# Patient Record
Sex: Female | Born: 1935 | Race: Black or African American | Hispanic: Yes | Smoking: Never smoker
Health system: Southern US, Community
[De-identification: ages and names within clinical notes are randomized; demographics above are authoritative.]

## PROBLEM LIST (undated history)

## (undated) DIAGNOSIS — IMO0001 Reserved for inherently not codable concepts without codable children: Secondary | ICD-10-CM

## (undated) DIAGNOSIS — J4489 Other specified chronic obstructive pulmonary disease: Secondary | ICD-10-CM

## (undated) DIAGNOSIS — K589 Irritable bowel syndrome without diarrhea: Secondary | ICD-10-CM

## (undated) DIAGNOSIS — Z860100 Personal history of colon polyps, unspecified: Secondary | ICD-10-CM

## (undated) DIAGNOSIS — G4700 Insomnia, unspecified: Secondary | ICD-10-CM

## (undated) DIAGNOSIS — C801 Malignant (primary) neoplasm, unspecified: Secondary | ICD-10-CM

## (undated) DIAGNOSIS — Z5189 Encounter for other specified aftercare: Secondary | ICD-10-CM

## (undated) DIAGNOSIS — J45909 Unspecified asthma, uncomplicated: Secondary | ICD-10-CM

## (undated) DIAGNOSIS — J449 Chronic obstructive pulmonary disease, unspecified: Secondary | ICD-10-CM

## (undated) DIAGNOSIS — K219 Gastro-esophageal reflux disease without esophagitis: Secondary | ICD-10-CM

## (undated) DIAGNOSIS — R5381 Other malaise: Secondary | ICD-10-CM

## (undated) DIAGNOSIS — D649 Anemia, unspecified: Secondary | ICD-10-CM

## (undated) DIAGNOSIS — R1013 Epigastric pain: Secondary | ICD-10-CM

## (undated) DIAGNOSIS — I10 Essential (primary) hypertension: Secondary | ICD-10-CM

## (undated) DIAGNOSIS — N184 Chronic kidney disease, stage 4 (severe): Secondary | ICD-10-CM

## (undated) DIAGNOSIS — R5383 Other fatigue: Secondary | ICD-10-CM

## (undated) DIAGNOSIS — C259 Malignant neoplasm of pancreas, unspecified: Secondary | ICD-10-CM

## (undated) DIAGNOSIS — F419 Anxiety disorder, unspecified: Secondary | ICD-10-CM

## (undated) DIAGNOSIS — R0602 Shortness of breath: Secondary | ICD-10-CM

## (undated) DIAGNOSIS — M1990 Unspecified osteoarthritis, unspecified site: Secondary | ICD-10-CM

## (undated) HISTORY — PX: THROAT SURGERY: SHX803

## (undated) HISTORY — DX: Anemia, unspecified: D64.9

## (undated) HISTORY — PX: NECK SURGERY: SHX720

## (undated) HISTORY — DX: Chronic obstructive pulmonary disease, unspecified: J44.9

## (undated) HISTORY — DX: Personal history of colon polyps, unspecified: Z86.0100

## (undated) HISTORY — DX: Anxiety disorder, unspecified: F41.9

## (undated) HISTORY — PX: TUMOR REMOVAL: SHX12

## (undated) HISTORY — DX: Malignant neoplasm of pancreas, unspecified: C25.9

## (undated) HISTORY — DX: Malignant (primary) neoplasm, unspecified: C80.1

## (undated) HISTORY — DX: Epigastric pain: R10.13

## (undated) HISTORY — DX: Personal history of colonic polyps: Z86.010

## (undated) HISTORY — PX: ABDOMINAL HYSTERECTOMY: SHX81

## (undated) HISTORY — DX: Other malaise: R53.81

## (undated) HISTORY — DX: Chronic kidney disease, stage 4 (severe): N18.4

## (undated) HISTORY — DX: Insomnia, unspecified: G47.00

## (undated) HISTORY — DX: Unspecified asthma, uncomplicated: J45.909

## (undated) HISTORY — DX: Other fatigue: R53.83

## (undated) HISTORY — DX: Other specified chronic obstructive pulmonary disease: J44.89

---

## 2011-01-13 DIAGNOSIS — I959 Hypotension, unspecified: Secondary | ICD-10-CM

## 2011-02-05 ENCOUNTER — Other Ambulatory Visit: Payer: Self-pay | Admitting: Nephrology

## 2011-02-05 ENCOUNTER — Ambulatory Visit
Admission: RE | Admit: 2011-02-05 | Discharge: 2011-02-05 | Disposition: A | Discharge status: Home / Self Care | Payer: Medicare Other | Source: Ambulatory Visit | Attending: Nephrology | Admitting: Nephrology

## 2011-02-05 DIAGNOSIS — R05 Cough: Secondary | ICD-10-CM

## 2011-02-05 DIAGNOSIS — R0600 Dyspnea, unspecified: Secondary | ICD-10-CM

## 2011-02-19 ENCOUNTER — Ambulatory Visit (INDEPENDENT_AMBULATORY_CARE_PROVIDER_SITE_OTHER): Payer: Medicare Other

## 2011-02-19 ENCOUNTER — Observation Stay (HOSPITAL_COMMUNITY)
Admission: EM | Admit: 2011-02-19 | Discharge: 2011-02-20 | Disposition: A | Discharge status: Home / Self Care | Payer: Medicare Other | Attending: Family Medicine | Admitting: Family Medicine

## 2011-02-19 ENCOUNTER — Encounter: Payer: Self-pay | Admitting: Emergency Medicine

## 2011-02-19 DIAGNOSIS — A088 Other specified intestinal infections: Secondary | ICD-10-CM

## 2011-02-19 DIAGNOSIS — E118 Type 2 diabetes mellitus with unspecified complications: Secondary | ICD-10-CM

## 2011-02-19 DIAGNOSIS — R51 Headache: Secondary | ICD-10-CM

## 2011-02-19 DIAGNOSIS — R112 Nausea with vomiting, unspecified: Secondary | ICD-10-CM | POA: Diagnosis present

## 2011-02-19 DIAGNOSIS — N186 End stage renal disease: Secondary | ICD-10-CM

## 2011-02-19 DIAGNOSIS — J45909 Unspecified asthma, uncomplicated: Secondary | ICD-10-CM | POA: Insufficient documentation

## 2011-02-19 DIAGNOSIS — Z992 Dependence on renal dialysis: Secondary | ICD-10-CM | POA: Insufficient documentation

## 2011-02-19 DIAGNOSIS — I10 Essential (primary) hypertension: Secondary | ICD-10-CM

## 2011-02-19 DIAGNOSIS — E860 Dehydration: Secondary | ICD-10-CM

## 2011-02-19 DIAGNOSIS — I120 Hypertensive chronic kidney disease with stage 5 chronic kidney disease or end stage renal disease: Secondary | ICD-10-CM | POA: Insufficient documentation

## 2011-02-19 DIAGNOSIS — K589 Irritable bowel syndrome without diarrhea: Secondary | ICD-10-CM

## 2011-02-19 DIAGNOSIS — R6883 Chills (without fever): Secondary | ICD-10-CM | POA: Insufficient documentation

## 2011-02-19 DIAGNOSIS — N189 Chronic kidney disease, unspecified: Secondary | ICD-10-CM

## 2011-02-19 HISTORY — DX: Encounter for other specified aftercare: Z51.89

## 2011-02-19 HISTORY — DX: Gastro-esophageal reflux disease without esophagitis: K21.9

## 2011-02-19 HISTORY — DX: Reserved for inherently not codable concepts without codable children: IMO0001

## 2011-02-19 HISTORY — DX: Essential (primary) hypertension: I10

## 2011-02-19 HISTORY — DX: Shortness of breath: R06.02

## 2011-02-19 HISTORY — DX: Irritable bowel syndrome, unspecified: K58.9

## 2011-02-19 HISTORY — DX: Unspecified osteoarthritis, unspecified site: M19.90

## 2011-02-19 LAB — DIFFERENTIAL
Basophils Absolute: 0 10*3/uL (ref 0.0–0.1)
Basophils Relative: 0 % (ref 0–1)
Eosinophils Absolute: 0 10*3/uL (ref 0.0–0.7)
Eosinophils Relative: 0 % (ref 0–5)
Lymphocytes Relative: 7 % — ABNORMAL LOW (ref 12–46)

## 2011-02-19 LAB — CBC
MCHC: 31.2 g/dL (ref 30.0–36.0)
MCV: 90.4 fL (ref 78.0–100.0)
Platelets: 205 10*3/uL (ref 150–400)
RDW: 14 % (ref 11.5–15.5)
WBC: 10.7 10*3/uL — ABNORMAL HIGH (ref 4.0–10.5)

## 2011-02-19 LAB — HEPATIC FUNCTION PANEL
ALT: 14 U/L (ref 0–35)
Total Protein: 7.4 g/dL (ref 6.0–8.3)

## 2011-02-19 LAB — BASIC METABOLIC PANEL
CO2: 37 mEq/L — ABNORMAL HIGH (ref 19–32)
Calcium: 10.1 mg/dL (ref 8.4–10.5)
Creatinine, Ser: 4.82 mg/dL — ABNORMAL HIGH (ref 0.50–1.10)

## 2011-02-19 MED ORDER — ALBUTEROL SULFATE HFA 108 (90 BASE) MCG/ACT IN AERS
2.0000 | INHALATION_SPRAY | RESPIRATORY_TRACT | Status: DC | PRN
  Filled 2011-02-19: qty 6.7

## 2011-02-19 MED ORDER — NIFEDIPINE ER 60 MG PO TB24
60.0000 mg | ORAL_TABLET | Freq: Every day | ORAL | Status: DC
  Administered 2011-02-19 – 2011-02-20 (×2): 60 mg via ORAL
  Filled 2011-02-19 (×2): qty 1

## 2011-02-19 MED ORDER — PANTOPRAZOLE SODIUM 40 MG PO TBEC
40.0000 mg | DELAYED_RELEASE_TABLET | Freq: Every day | ORAL | Status: DC
  Administered 2011-02-19 – 2011-02-20 (×2): 40 mg via ORAL

## 2011-02-19 MED ORDER — ONDANSETRON HCL 4 MG PO TABS: 4.0000 mg | ORAL_TABLET | Freq: Four times a day (QID) | ORAL | Status: DC | PRN

## 2011-02-19 MED ORDER — METOPROLOL TARTRATE 50 MG PO TABS
50.0000 mg | ORAL_TABLET | Freq: Two times a day (BID) | ORAL | Status: DC
  Administered 2011-02-19 – 2011-02-20 (×2): 50 mg via ORAL
  Filled 2011-02-19 (×3): qty 1

## 2011-02-19 MED ORDER — RENA-VITE PO TABS
1.0000 | ORAL_TABLET | Freq: Every day | ORAL | Status: DC
  Administered 2011-02-20: 1 via ORAL
  Filled 2011-02-19: qty 1

## 2011-02-19 MED ORDER — ONDANSETRON HCL 4 MG/2ML IJ SOLN
4.0000 mg | Freq: Once | INTRAMUSCULAR | Status: AC
  Administered 2011-02-19: 4 mg via INTRAMUSCULAR
  Filled 2011-02-19: qty 2

## 2011-02-19 MED ORDER — ASPIRIN 81 MG PO TABS: 81.0000 mg | ORAL_TABLET | Freq: Every day | ORAL | Status: DC

## 2011-02-19 MED ORDER — HEPARIN SODIUM (PORCINE) 5000 UNIT/ML IJ SOLN
5000.0000 [IU] | Freq: Three times a day (TID) | INTRAMUSCULAR | Status: DC
  Administered 2011-02-19 – 2011-02-20 (×3): 5000 [IU] via SUBCUTANEOUS
  Filled 2011-02-19 (×5): qty 1

## 2011-02-19 MED ORDER — DARBEPOETIN ALFA-POLYSORBATE 25 MCG/0.42ML IJ SOLN
25.0000 ug | INTRAMUSCULAR | Status: DC
  Administered 2011-02-20: 25 ug via INTRAVENOUS_CENTRAL
  Administered 2011-02-20: 25 ug via INTRAVENOUS
  Filled 2011-02-19: qty 0.42

## 2011-02-19 MED ORDER — FLUTICASONE PROPIONATE HFA 44 MCG/ACT IN AERO
1.0000 | INHALATION_SPRAY | Freq: Two times a day (BID) | RESPIRATORY_TRACT | Status: DC
  Administered 2011-02-19 – 2011-02-20 (×2): 1 via RESPIRATORY_TRACT
  Filled 2011-02-19: qty 10.6

## 2011-02-19 MED ORDER — POLYETHYLENE GLYCOL 3350 17 G PO PACK
17.0000 g | PACK | Freq: Every day | ORAL | Status: DC
  Administered 2011-02-19 – 2011-02-20 (×2): 17 g via ORAL
  Filled 2011-02-19 (×2): qty 1

## 2011-02-19 MED ORDER — SODIUM CHLORIDE 0.9 % IV BOLUS (SEPSIS)
500.0000 mL | INTRAVENOUS | Status: AC
  Administered 2011-02-19: 500 mL via INTRAVENOUS

## 2011-02-19 MED ORDER — ASPIRIN EC 81 MG PO TBEC
81.0000 mg | DELAYED_RELEASE_TABLET | Freq: Every day | ORAL | Status: DC
  Administered 2011-02-19 – 2011-02-20 (×2): 81 mg via ORAL
  Filled 2011-02-19 (×2): qty 1

## 2011-02-19 MED ORDER — MONTELUKAST SODIUM 10 MG PO TABS
10.0000 mg | ORAL_TABLET | Freq: Every day | ORAL | Status: DC
  Administered 2011-02-19: 10 mg via ORAL
  Filled 2011-02-19 (×2): qty 1

## 2011-02-19 MED ORDER — OLMESARTAN MEDOXOMIL 40 MG PO TABS
40.0000 mg | ORAL_TABLET | Freq: Every day | ORAL | Status: DC
  Administered 2011-02-19: 40 mg via ORAL
  Filled 2011-02-19 (×2): qty 1

## 2011-02-19 NOTE — ED Provider Notes (Signed)
History     CSN: [id number] Arrival date & time: 02/19/2011 12:23 PM   First MD Initiated Contact with Patient 02/19/11 1230      Chief Complaint  Patient presents with  . Nausea  . Emesis  . Chills   HPI Patient reports that she has had multiple episodes of nausea and vomiting. Patient reports dehdyrated. No abdominal pain. No chest pain. No SOB.   Past Medical History  Diagnosis Date  . Hypertension   . Renal disorder   . Asthma     Past Surgical History  Procedure Date  . Abdominal hysterectomy   . Throat surgery     History reviewed. No pertinent family history.  History  Substance Use Topics  . Smoking status: Never Smoker   . Smokeless tobacco: Never Used  . Alcohol Use: No    OB History    Grav Para Term Preterm Abortions TAB SAB Ect Mult Living                  Review of Systems  Constitutional: Negative for fever, chills, diaphoresis and appetite change.  HENT: Negative for neck pain.   Eyes: Negative for photophobia and visual disturbance.  Respiratory: Negative for cough, chest tightness and shortness of breath.   Cardiovascular: Negative for chest pain.  Gastrointestinal: Positive for nausea and vomiting. Negative for abdominal pain, constipation and blood in stool.  Genitourinary: Negative for flank pain.  Musculoskeletal: Negative for back pain.  Skin: Negative for rash.  Neurological: Negative for weakness and numbness.  All other systems reviewed and are negative.    Allergies  Excedrin  Home Medications   Current Outpatient Rx  Name Route Sig Dispense Refill  . ACETAMINOPHEN 325 MG PO TABS Oral Take 650 mg by mouth every 6 (six) hours as needed. For pain    . ASPIRIN 81 MG PO TABS Oral Take 81 mg by mouth daily. Except on dialysis days    . METOPROLOL TARTRATE 50 MG PO TABS Oral Take 50 mg by mouth 2 (two) times daily.      Marland Kitchen NIFEDIPINE ER 60 MG PO TB24 Oral Take 60 mg by mouth daily.      Marland Kitchen POLYETHYLENE GLYCOL 3350 PO PACK Oral  Take 17 g by mouth daily.        BP 191/89  Pulse 79  Temp(Src) 97.9 F (36.6 C) (Oral)  Resp 18  SpO2 95%  Physical Exam  Nursing note and vitals reviewed. Constitutional: She is oriented to person, place, and time. She appears well-developed and well-nourished. No distress.       Nontoxic appearing  HENT:  Head: Normocephalic and atraumatic.       Dry mucous membranes  Eyes: EOM are normal. Pupils are equal, round, and reactive to light.  Neck: Normal range of motion. Neck supple.  Cardiovascular: Normal rate and regular rhythm.   Pulmonary/Chest: Effort normal and breath sounds normal. No respiratory distress.  Abdominal: Soft. She exhibits no distension and no mass. There is no tenderness. There is no rebound and no guarding.       No pulsatile mass  Musculoskeletal: Normal range of motion. She exhibits no edema and no tenderness.       Negative straight leg test bilateral lower extremities. Baseline ROM, moves extremities with no obvious new focal weakness. Full strength in both lower extremities 5/5. No weakness with extension of great toe, L5 nerve room is not impaired. S1 tested and not impaired with testing ankle jerk  reflex, good plantar flexion.  Lymphadenopathy:    She has no cervical adenopathy.  Neurological: She is alert and oriented to person, place, and time. She has normal strength. She displays no atrophy. No cranial nerve deficit or sensory deficit. She exhibits normal muscle tone. She displays a negative Romberg sign. Gait normal. She displays no Babinski's sign on the right side. She displays no Babinski's sign on the left side.  Reflex Scores:      Patellar reflexes are 2+ on the right side and 2+ on the left side.      Achilles reflexes are 2+ on the right side and 2+ on the left side.       Advised patient of warning signs to return. Patient stated agreement and understanding.  Skin: Skin is warm and dry. No rash noted. She is not diaphoretic. No erythema. No  pallor.  Psychiatric: She has a normal mood and affect. Her behavior is normal. Judgment and thought content normal.    ED Course  Procedures (including critical care time)  Patient seen and evaluated.  VSS reviewed. . Nursing notes reviewed. Discussed with attending physician. Initial testing ordered. Will monitor the patient closely. They agree with the treatment plan and diagnosis.   Results for orders placed during the hospital encounter of 02/19/11  BASIC METABOLIC PANEL      Component Value Range   Sodium 144  135 - 145 (mEq/L)   Potassium 4.7  3.5 - 5.1 (mEq/L)   Chloride 95 (*) 96 - 112 (mEq/L)   CO2 37 (*) 19 - 32 (mEq/L)   Glucose, Bld 109 (*) 70 - 99 (mg/dL)   BUN 33 (*) 6 - 23 (mg/dL)   Creatinine, Ser 1.61 (*) 0.50 - 1.10 (mg/dL)   Calcium 09.6  8.4 - 10.5 (mg/dL)   GFR calc non Af Amer 7 (*) >90 (mL/min)   GFR calc Af Amer 9 (*) >90 (mL/min)  CBC      Component Value Range   WBC 10.7 (*) 4.0 - 10.5 (K/uL)   RBC 4.18  3.87 - 5.11 (MIL/uL)   Hemoglobin 11.8 (*) 12.0 - 15.0 (g/dL)   HCT 04.5  40.9 - 81.1 (%)   MCV 90.4  78.0 - 100.0 (fL)   MCH 28.2  26.0 - 34.0 (pg)   MCHC 31.2  30.0 - 36.0 (g/dL)   RDW 91.4  78.2 - 95.6 (%)   Platelets 205  150 - 400 (K/uL)  DIFFERENTIAL      Component Value Range   Neutrophils Relative 87 (*) 43 - 77 (%)   Neutro Abs 9.3 (*) 1.7 - 7.7 (K/uL)   Lymphocytes Relative 7 (*) 12 - 46 (%)   Lymphs Abs 0.7  0.7 - 4.0 (K/uL)   Monocytes Relative 6  3 - 12 (%)   Monocytes Absolute 0.6  0.1 - 1.0 (K/uL)   Eosinophils Relative 0  0 - 5 (%)   Eosinophils Absolute 0.0  0.0 - 0.7 (K/uL)   Basophils Relative 0  0 - 1 (%)   Basophils Absolute 0.0  0.0 - 0.1 (K/uL)   Dg Chest 2 View  02/05/2011  *RADIOLOGY REPORT*  Clinical Data: Dialysis patient with cough and shortness of breath.  CHEST - 2 VIEW  Comparison: None.  Findings: The heart is mildly enlarged.  Mediastinal contours are otherwise unremarkable for age with probable vascular  tortuosity accounting for right paratracheal prominence.  The lungs are clear. There is no pleural effusion.  Advanced glenohumeral degenerative  changes are present bilaterally.  There is an age indeterminate lower thoracic compression deformity.  IMPRESSION: No acute cardiopulmonary process identified.  Mild cardiomegaly.  Age indeterminate lower thoracic compression deformity. Per CMS PQRS reporting requirements (PQRS Measure 24): Given the patient's age of greater than 50 and the fracture site (hip, distal radius, or spine), the patient should be tested for osteoporosis using DXA, and the appropriate treatment considered based on the DXA results.  Original Report Authenticated By: Gerrianne Scale, M.D.    Patient seen and re-evaluated. Resting comfortably. VSS stable. NAD. Patient notified of testing results. Stated agreement and understanding. Patient stated understanding to treatment plan and diagnosis.   2:50 PM spoke to family practice center. Who admit the patient for observation for nausea, vomiting, dehydration.  2:50 PM spoke to daughter who was notified of lab findings and treatment plan. Stated agreement and understanding.    MDM   Nausea, vomiting,   Spoke to dialysis. Dehydrated. IVF. Dr. Briant Cedar wanted her evaluated. Dr. Rennis Harding admit for obstervation. Family practice.   Demetrius Charity, [place] 02/19/11 [phone]

## 2011-02-19 NOTE — Consult Note (Signed)
[facility] Renal Consultation Note  Indication for Consultation:  Management of ESRD/hemodialysis; anemia, hypertension/volume and secondary hyperparathyroidism  HPI: Brenda Leach is a 77 y.o. 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 Last dialysis was mon 12/03 and unremarkable per pt. Given iv fluids and Zofran in er with symptoms resolved now except for "stomach burning sensation I get with my Bowel syndrome"  Of note pt. Recently transferred to [facility] for HD from [facility]. 01/21/11 to live with daughter. HD started in [place] 12/28/2001.  Dialysis Orders: Center: [facility]  on tths . EDW 47kg HD Bath 2,0k,2.25ca  Time 3.0 hrs Heparin stand. Access left fa avgg BFR 400 DFR 800    Zemplar 4 mcg IV/HD Epogen 1400   Units IV/HD  Venofer  0  Other 0    Past Medical History  Diagnosis Date  . Hypertension   . ESRD (end stage renal disease) on dialysis   . Asthma   . IBS (irritable bowel syndrome)     Past Surgical History  Procedure Date  . Abdominal hysterectomy   . Throat surgery       Family History  Problem Relation Age of Onset  . Liver cancer Father   . Stroke Mother   . Coronary artery disease    . Hypertension    . Diabetes type II     Soc= Lives with daughter, 1 son in [place]. Moved from [place] as above   reports that she has never smoked. She has never used smokeless tobacco. She reports that she does not drink alcohol or use illicit drugs.   Allergies  Allergen Reactions  . Excedrin International aid/development worker) Other (See Comments)    dizziness    Prior to Admission medications   Medication Sig Start Date End Date Taking? Authorizing Provider  acetaminophen (TYLENOL) 325 MG tablet Take 650 mg by mouth every 6 (six) hours as needed. For pain   Yes Historical Provider, MD  aspirin 81 MG tablet Take 81 mg by mouth daily.  Except on dialysis days   Yes Historical Provider, MD  metoprolol (LOPRESSOR) 50 MG tablet Take 50 mg by mouth 2 (two) times daily.     Yes Historical Provider, MD  NIFEdipine (PROCARDIA-XL/ADALAT CC) 60 MG 24 hr tablet Take 60 mg by mouth daily.     Yes Historical Provider, MD  polyethylene glycol (MIRALAX / GLYCOLAX) packet Take 17 g by mouth daily.     Yes Historical Provider, MD    WUJ:[id number], ondansetron  Results for orders placed during the hospital encounter of 02/19/11 (from the past 48 hour(s))  BASIC METABOLIC PANEL     Status: Abnormal   Collection Time   02/19/11  1:34 PM      Component Value Range Comment   Sodium 144  135 - 145 (mEq/L)    Potassium 4.7  3.5 - 5.1 (mEq/L)    Chloride 95 (*) 96 - 112 (mEq/L)    CO2 37 (*) 19 - 32 (mEq/L)    Glucose, Bld 109 (*) 70 - 99 (mg/dL)    BUN 33 (*) 6 - 23 (mg/dL)    Creatinine, Ser 9.56 (*) 0.50 - 1.10 (mg/dL)    Calcium 21.3  8.4 - 10.5 (mg/dL)    GFR calc non Af Amer 7 (*) >90 (mL/min)    GFR calc Af Amer 9 (*) >90 (mL/min)  CBC     Status: Abnormal   Collection Time   02/19/11  1:34 PM      Component Value Range Comment   WBC 10.7 (*) 4.0 - 10.5 (K/uL)    RBC 4.18  3.87 - 5.11 (MIL/uL)    Hemoglobin 11.8 (*) 12.0 - 15.0 (g/dL)    HCT 16.1  09.6 - 04.5 (%)    MCV 90.4  78.0 - 100.0 (fL)    MCH 28.2  26.0 - 34.0 (pg)    MCHC 31.2  30.0 - 36.0 (g/dL)    RDW 40.9  81.1 - 91.4 (%)    Platelets 205  150 - 400 (K/uL)   DIFFERENTIAL     Status: Abnormal   Collection Time   02/19/11  1:34 PM      Component Value Range Comment   Neutrophils Relative 87 (*) 43 - 77 (%)    Neutro Abs 9.3 (*) 1.7 - 7.7 (K/uL)    Lymphocytes Relative 7 (*) 12 - 46 (%)    Lymphs Abs 0.7  0.7 - 4.0 (K/uL)    Monocytes Relative 6  3 - 12 (%)    Monocytes Absolute 0.6  0.1 - 1.0 (K/uL)    Eosinophils Relative 0  0 - 5 (%)    Eosinophils Absolute 0.0  0.0 - 0.7 (K/uL)    Basophils Relative 0  0 - 1 (%)    Basophils Absolute 0.0  0.0 - 0.1 (K/uL)     EKG: normal EKG, normal sinus rhythm, unchanged from previous tracings, LBBB.  ROS: Denying fever, sweats, sob,chest pain, dysuria, and diarrhea   Physical Exam: Filed Vitals:   02/19/11 1647  BP: 174/75  Pulse: 82  Temp: 97.9 F (36.6 C)  Resp: 18     General:Alert elderly bf thin pleasant, nad HEENT:[place],eom intact, mm dry  Neck:no jvd, supple Heart:RRR Lungs:CTA Abdomen:BS pos. Nl, soft, min epigastric tenderness Extremities:no pedal edema Skin:dry , no overt ulcer or rash Neuro:ox3, moves all ext. = Dialysis Access:pos. Bruit fa avgg  Assessment/Plan: 1. Nausea/Vomiting with mild dehydration=? IBS flare, Admit team Treating 2. ESRD -  tths hd nl [facility] ,keep even on hd  With vol  low 3 Hypertension/volume  - meds per Admit team, no vol off with hd 4. Anemia  - epo on hd 5. Metabolic bone disease -  No zemplar or binders op, Renavite 6. HO COPD=uses prn inhalers at home  Lenny Pastel, [facility] Beeper [phone] 02/19/2011, 4:54 PM

## 2011-02-19 NOTE — ED Provider Notes (Signed)
See prior note   Ward Givens, MD 02/19/11 1919

## 2011-02-19 NOTE — ED Notes (Signed)
Pt resting quietly in bed. No distress noted. Family at bedside. Awaiting inpatient bed and orders.

## 2011-02-19 NOTE — ED Notes (Signed)
Admitting MD at bedside.

## 2011-02-19 NOTE — ED Notes (Signed)
Pt sent from [facility] for N/V and chills x 1 day. Pt is a dialysis pt and had dialysis yesterday.

## 2011-02-19 NOTE — ED Provider Notes (Signed)
Patient has end-stage renal disease and gets dialysis on Tuesday Thursday and Saturday. She was dialyzed yesterday. About midnight she started vomiting which has persisted throughout the day.  She was seen at [facility] and sent to the ER for admission for dehydration  Patient elderly alert and cooperative she looks like she feels bad. Mucous membranes are dry.  Medical screening examination/treatment/procedure(s) were conducted as a shared visit with non-physician practitioner(s) and myself.  I personally evaluated the patient during the encounter Devoria Albe, MD, Franz Dell, MD 02/19/11 [phone]

## 2011-02-19 NOTE — H&P (Signed)
I interviewed and examined this patient and discussed the care plan with Dr.Spiegel and the FPTS team and agree with assessment and plan as documented in the admission note for today. The patient believes that this is a typical attack of her "IBS" and had no ill contacts to the knowledge of herself or her daughter-in-law. She reports that her last emesis was this morning. If she tolerates the liquid diet that was brought in the room while I was visiting, her diet can be gradually advanced.     Markeise Mathews A. Sheffield Slider, MD Family Medicine Teaching Service Attending  02/19/2011 5:48 PM

## 2011-02-19 NOTE — H&P (Signed)
Family Medicine Teaching [facility] Admission History and Physical  Patient name: Brenda Leach Medical record number: [id number] Date of birth: [birth year] Age: 77 y.o. Gender: female  Primary Care Provider: No primary provider on file.  Chief Complaint:  Nausea and vomiting  History of Present Illness: Brenda Leach is a 77 y.o. year old female  With end-stage renal disease on hemodialysis presenting with  Nausea and vomiting times one day. She began vomiting last night and vomited several times. She denies seeing any blood in the vomit. She denies any diarrhea. She has not been able to keep food or liquid down. She does make some urine and she has not had any urinary changes. Denies chest pain , worsened shortness of breath. Complains of chills. Patient was seen in the [place] [facility] for these symptoms. She was given Zofran and since the ED. Since her Zofran dose, she has not had any further vomiting. She was given half a liter of fluid in the ED. She states that her nausea is improved but she still has some burning in her abdomen. She states she was diagnosed with IBS and has had burning in her abdomen when she gets hungry since that time.  Review Of Systems: Per HPI with the following additions:  Dialysis yesterday. Otherwise 12 point review of systems was performed and was unremarkable.  There is no problem list on file for this patient.   Past Medical History: Past Medical History  Diagnosis Date  . Hypertension   . Renal disorder   . Asthma     Past Surgical History: Past Surgical History  Procedure Date  . Abdominal hysterectomy   . Throat surgery      Social History: History   Social History  . Marital Status: Widowed    Spouse Name: N/A    Number of Children: N/A  . Years of Education: N/A   Social History Main Topics  . Smoking status: Never Smoker   . Smokeless tobacco: Never Used  . Alcohol Use: No  . Drug Use: No  . Sexually Active:    Other  Topics Concern  . None   Social History Narrative  . None    Family History: History reviewed. No pertinent family history.  Allergies: Allergies  Allergen Reactions  . Excedrin International aid/development worker) Other (See Comments)    dizziness    Current Facility-Administered Medications  Medication Dose Route Frequency Provider Last Rate Last Dose  . heparin injection 5,000 Units  5,000 Units Subcutaneous Q8H Ellery Plunk      . ondansetron (ZOFRAN) injection 4 mg  4 mg Intramuscular Once [employer], PA   4 mg at 02/19/11 1511  . ondansetron (ZOFRAN) tablet 4 mg  4 mg Oral Q6H PRN Ellery Plunk      . sodium chloride 0.9 % bolus 500 mL  500 mL Intravenous STAT Jennifer A Pitylak, PA   500 mL at 02/19/11 1514   Current Outpatient Prescriptions  Medication Sig Dispense Refill  . acetaminophen (TYLENOL) 325 MG tablet Take 650 mg by mouth every 6 (six) hours as needed. For pain      . aspirin 81 MG tablet Take 81 mg by mouth daily. Except on dialysis days      . metoprolol (LOPRESSOR) 50 MG tablet Take 50 mg by mouth 2 (two) times daily.        Marland Kitchen NIFEdipine (PROCARDIA-XL/ADALAT CC) 60 MG 24 hr tablet Take 60 mg by mouth daily.        Marland Kitchen  polyethylene glycol (MIRALAX / GLYCOLAX) packet Take 17 g by mouth daily.           Physical Exam: BP 191/89  Pulse 79  Temp(Src) 97.9 F (36.6 C) (Oral)  Resp 18  SpO2 95%            General: alert, cooperative and no distress HEENT: PERRLA, extra ocular movement intact, sclera clear, anicteric, neck supple with midline trachea and mmm Heart: S1, S2 normal, no murmur, rub or gallop, regular rate and rhythm Lungs: clear to auscultation, no wheezes or rales and unlabored breathing Abdomen: mild tenderness in the in the entire abdomen. Extremities: extremities normal, atraumatic, no cyanosis or edema Musculoskeletal: no joint tenderness, deformity or swelling Skin:no rashes, no jaundice Neurology: normal without focal findings, mental  status, speech normal, alert and oriented x3 and PERLA  Labs and Imaging: Lab Results  Component Value Date/Time   NA 144 02/19/2011  1:34 PM   K 4.7 02/19/2011  1:34 PM   CL 95* 02/19/2011  1:34 PM   CO2 37* 02/19/2011  1:34 PM   BUN 33* 02/19/2011  1:34 PM   CREATININE 4.82* 02/19/2011  1:34 PM   GLUCOSE 109* 02/19/2011  1:34 PM   Lab Results  Component Value Date   WBC 10.7* 02/19/2011   HGB 11.8* 02/19/2011   HCT 37.8 02/19/2011   MCV 90.4 02/19/2011   PLT 205 02/19/2011       Assessment and Plan: Brenda Leach is a 77 y.o. year old female presenting with  Nausea and vomiting 1.  nausea and vomiting- patient with one day of nausea and vomiting. She may have been mildly dehydrated, however after 400 mL she was clinically euvolemic. We will be cautious with any fluid hydration as she is a dialysis patient. He will give Zofran for any nausea. Plan to observe overnight.  2. FEN/GI:  Plan to saline lock IV. Start clear diet and advance as tolerated to renal diet. Plan to have renal see patient for dialysis before discharge. 3. Prophylaxis:  Heparin subcutaneous 4.   CODE STATUS -no CODE BLUE 4. Disposition:  Likely short stay. Will place patient on observation and will discharge after dialysis tomorrow if patient remains stable.   SignedEllery Plunk, MD Family Medicine Resident PGY-3 02/19/2011 3:27 PM  [phone]

## 2011-02-20 ENCOUNTER — Observation Stay (HOSPITAL_COMMUNITY): Payer: Medicare Other

## 2011-02-20 LAB — CBC
HCT: 32.2 % — ABNORMAL LOW (ref 36.0–46.0)
MCV: 91.7 fL (ref 78.0–100.0)
RBC: 3.51 MIL/uL — ABNORMAL LOW (ref 3.87–5.11)
WBC: 11.5 10*3/uL — ABNORMAL HIGH (ref 4.0–10.5)

## 2011-02-20 LAB — BASIC METABOLIC PANEL
CO2: 33 mEq/L — ABNORMAL HIGH (ref 19–32)
Chloride: 95 mEq/L — ABNORMAL LOW (ref 96–112)
GFR calc Af Amer: 6 mL/min — ABNORMAL LOW (ref >90)
Potassium: 5.1 mEq/L (ref 3.5–5.1)
Sodium: 141 mEq/L (ref 135–145)

## 2011-02-20 MED ORDER — ACETAMINOPHEN 325 MG PO TABS
ORAL_TABLET | ORAL | Status: AC
  Administered 2011-02-20: 650 mg
  Filled 2011-02-20: qty 2

## 2011-02-20 MED ORDER — ONDANSETRON HCL 4 MG PO TABS: 4.0000 mg | ORAL_TABLET | Freq: Four times a day (QID) | ORAL | Status: AC | PRN

## 2011-02-20 MED ORDER — DARBEPOETIN ALFA-POLYSORBATE 25 MCG/0.42ML IJ SOLN
INTRAMUSCULAR | Status: AC
  Administered 2011-02-20: 25 ug via INTRAVENOUS_CENTRAL
  Filled 2011-02-20: qty 0.42

## 2011-02-20 NOTE — Progress Notes (Signed)
Pt discharged home. Sent down in wheel chair. Provided pt with discharge instructions and prescriptions. Removed IV site no signs of infections or infiltration. Annitta Needs, RN 02/20/2011

## 2011-02-20 NOTE — Consult Note (Signed)
Renal Consultation Note  Indication for Consultation: Management of ESRD/hemodialysis; anemia, hypertension/volume and secondary hyperparathyroidism  HPI: Brenda Leach is a 77 y.o. female with ESRD secondary to Glomerulal Nephritis and Irritable Bowel Syndrome dx in [place] admitted yesterday with Nausea/ Vomiting/ Dehydration with episodes of vomiting one day PTA. Last dialysis was mon 12/03 and unremarkable per pt. Given iv fluids and Zofran in er with symptoms resolved now except for "stomach burning sensation I get with my Bowel syndrome" Of note pt. Recently transferred to [facility] for HD from [facility]. 01/21/11 to live with daughter. HD started in [place] 12/28/2001.  She has had GI symptoms for the last year apparently associated with a 50 pound weight loss.  Today she feels much better after yesterdays admission. Dialysis Orders: Center: [facility] on t[facility] .  EDW 47kg HD Bath 2,0k,2.25ca Time 3.0 hrs Heparin stand. Access left fa avgg BFR 400 DFR 800  Zemplar 4 mcg IV/HD Epogen 1400 Units IV/HD Venofer 0  Other 0  Past Medical History   Diagnosis  Date   .  Hypertension    .  ESRD (end stage renal disease) on dialysis    .  Asthma    .  IBS (irritable bowel syndrome)     Past Surgical History   Procedure  Date   .  Abdominal hysterectomy    .  Throat surgery     Family History   Problem  Relation  Age of Onset   .  Liver cancer  Father    .  Stroke  Mother    .  Coronary artery disease     .  Hypertension     .  Diabetes type II      Soc= Lives with daughter, 1 son in [place]. Moved from [place] as above  reports that she has never smoked. She has never used smokeless tobacco. She reports that she does not drink alcohol or use illicit drugs.  Allergies   Allergen  Reactions   .  Excedrin International aid/development worker)  Other (See Comments)     dizziness    Prior to Admission medications   Medication  Sig  Start Date  End Date  Taking?  Authorizing Provider   acetaminophen (TYLENOL) 325 MG tablet   Take 650 mg by mouth every 6 (six) hours as needed. For pain    Yes  Historical Provider, MD   aspirin 81 MG tablet  Take 81 mg by mouth daily. Except on dialysis days    Yes  Historical Provider, MD   metoprolol (LOPRESSOR) 50 MG tablet  Take 50 mg by mouth 2 (two) times daily.    Yes  Historical Provider, MD   NIFEdipine (PROCARDIA-XL/ADALAT CC) 60 MG 24 hr tablet  Take 60 mg by mouth daily.    Yes  Historical Provider, MD   polyethylene glycol (MIRALAX / GLYCOLAX) packet  Take 17 g by mouth daily.    Yes  Historical Provider, MD   BJY:NWGNFAOZH, ondansetron  Results for orders placed during the hospital encounter of 02/19/11 (from the past 48 hour(s))   BASIC METABOLIC PANEL Status: Abnormal    Collection Time    02/19/11 1:34 PM   Component  Value  Range  Comment    Sodium  144  135 - 145 (mEq/L)     Potassium  4.7  3.5 - 5.1 (mEq/L)     Chloride  95 (*)  96 - 112 (mEq/L)     CO2  37 (*)  19 - 32 (mEq/L)     Glucose, Bld  109 (*)  70 - 99 (mg/dL)     BUN  33 (*)  6 - 23 (mg/dL)     Creatinine, Ser  4.09 (*)  0.50 - 1.10 (mg/dL)     Calcium  81.1  8.4 - 10.5 (mg/dL)     GFR calc non Af Amer  7 (*)  >90 (mL/min)     GFR calc Af Amer  9 (*)  >90 (mL/min)    CBC Status: Abnormal    Collection Time    02/19/11 1:34 PM   Component  Value  Range  Comment    WBC  10.7 (*)  4.0 - 10.5 (K/uL)     RBC  4.18  3.87 - 5.11 (MIL/uL)     Hemoglobin  11.8 (*)  12.0 - 15.0 (g/dL)     HCT  91.4  78.2 - 46.0 (%)     MCV  90.4  78.0 - 100.0 (fL)     MCH  28.2  26.0 - 34.0 (pg)     MCHC  31.2  30.0 - 36.0 (g/dL)     RDW  95.6  21.3 - 15.5 (%)     Platelets  205  150 - 400 (K/uL)    DIFFERENTIAL Status: Abnormal    Collection Time    02/19/11 1:34 PM   Component  Value  Range  Comment    Neutrophils Relative  87 (*)  43 - 77 (%)     Neutro Abs  9.3 (*)  1.7 - 7.7 (K/uL)     Lymphocytes Relative  7 (*)  12 - 46 (%)     Lymphs Abs  0.7  0.7 - 4.0 (K/uL)     Monocytes Relative  6  3 - 12 (%)      Monocytes Absolute  0.6  0.1 - 1.0 (K/uL)     Eosinophils Relative  0  0 - 5 (%)     Eosinophils Absolute  0.0  0.0 - 0.7 (K/uL)     Basophils Relative  0  0 - 1 (%)     Basophils Absolute  0.0  0.0 - 0.1 (K/uL)     EKG: normal EKG, normal sinus rhythm, unchanged from previous tracings, LBBB.  ROS: Denying fever, sweats, sob,chest pain, dysuria, and diarrhea  Physical Exam:  Filed Vitals:    02/19/11 1647   BP:  174/75   Pulse:  82   Temp:  97.9 F (36.6 C)   Resp:  18    General:Alert elderly bf thin pleasant, nad  HEENT:Glendo,eom intact, mm dry  Neck:no jvd, supple  Heart:RRR  Lungs:CTA  Abdomen:BS pos. Nl, soft, min epigastric tenderness  Extremities:no pedal edema  Skin:dry , no overt ulcer or rash  Neuro:ox3, moves all ext. =  Dialysis Access:pos. Bruit fa avgg   Assessment/Plan:  1. Nausea/Vomiting improved 2. ESRD - tths hd nl adm farm ,keep even on hd With vol low  3 Hypertension/volume - meds per Admit team, no vol off with hd  4. Anemia - epo on hd  5. Metabolic bone disease - No zemplar or binders op, Renavite  6. HO COPD=uses prn inhalers at home   [facility]

## 2011-02-20 NOTE — Progress Notes (Signed)
Family Medicine Teaching Service Attending Note  I interviewed and examined patient Brenda Leach and reviewed their tests and x-rays.  I discussed with Dr. Konrad Dolores and reviewed their note for today.  I agree with their assessment and plan.     Additionally  Saw in dialysis. She is feeling well and tolerating sips and crackers See how she takes po after dialysis.  Can discharge when taking reliably and no vomiting Will need to follow up with a PCP to investigate weight loss

## 2011-02-20 NOTE — Progress Notes (Signed)
Family Medicine Teaching Service [facility] Progress Note  Patient name: Brenda Leach Medical record number: [id number] Date of birth: [birth year] Age: 77 y.o. Gender: female    LOS: 1 day   Primary Care Provider: No primary provider on file.  Overnight Events: No acute events overnight. Slept well. Tolerated her liquid diet without nausea or emesis. One bout of emesis this morning while attempting to take MiraLAX. Patient states she is hungry and wants to eat. Patient examined while in dialysis.   Objective: Vital signs in last 24 hours: Temp:  [97.9 F (36.6 C)-99 F (37.2 C)] 99 F (37.2 C) (12/06 0756) Pulse Rate:  [79-97] 82  (12/06 0900) Resp:  [16-34] 24  (12/06 0900) BP: (101-191)/(54-89) 101/58 mmHg (12/06 0900) SpO2:  [92 %-100 %] 100 % (12/06 0830) Weight:  [100 lb 1.4 oz (45.4 kg)-100 lb 1.6 oz (45.405 kg)] 100 lb 1.4 oz (45.4 kg) (12/06 0756)  Wt Readings from Last 3 Encounters:  02/20/11 100 lb 1.4 oz (45.4 kg)     Current Facility-Administered Medications  Medication Dose Route Frequency Provider Last Rate Last Dose  . acetaminophen (TYLENOL) 325 MG tablet        650 mg at 02/20/11 0910  . albuterol (PROVENTIL HFA;VENTOLIN HFA) 108 (90 BASE) MCG/ACT inhaler 2 puff  2 puff Inhalation Q4H PRN Ellery Plunk      . aspirin EC tablet 81 mg  81 mg Oral Daily Tobin Chad   81 mg at 02/19/11 2058  . darbepoetin (ARANESP) injection 25 mcg  25 mcg Intravenous Q Thu-HD Donald Pore, PA      . fluticasone (FLOVENT HFA) 44 MCG/ACT inhaler 1 puff  1 puff Inhalation BID Ellery Plunk   1 puff at 02/20/11 0759  . heparin injection 5,000 Units  5,000 Units Subcutaneous Q8H Rachel Spiegel   5,000 Units at 02/20/11 0536  . metoprolol (LOPRESSOR) tablet 50 mg  50 mg Oral BID Rachel Spiegel   50 mg at 02/19/11 2257  . montelukast (SINGULAIR) tablet 10 mg  10 mg Oral QHS Rachel Spiegel   10 mg at 02/19/11 2105  . multivitamin (RENA-VIT) tablet 1 tablet  1 tablet Oral Daily  Piedad Climes [place], PA      . NIFEdipine (PROCARDIA-XL/ADALAT CC) 24 hr tablet 60 mg  60 mg Oral Daily Rachel Spiegel   60 mg at 02/19/11 2105  . olmesartan (BENICAR) tablet 40 mg  40 mg Oral Daily Rachel Spiegel   40 mg at 02/19/11 2059  . ondansetron (ZOFRAN) injection 4 mg  4 mg Intramuscular Once [employer], PA   4 mg at 02/19/11 1511  . ondansetron (ZOFRAN) tablet 4 mg  4 mg Oral Q6H PRN Ellery Plunk      . pantoprazole (PROTONIX) EC tablet 40 mg  40 mg Oral Q1200 Rachel Spiegel   40 mg at 02/19/11 2000  . polyethylene glycol (MIRALAX / GLYCOLAX) packet 17 g  17 g Oral Daily Rachel Spiegel   17 g at 02/19/11 2100  . sodium chloride 0.9 % bolus 500 mL  500 mL Intravenous STAT Jennifer A Pitylak, PA   500 mL at 02/19/11 1514  . DISCONTD: aspirin tablet 81 mg  81 mg Oral Daily Ellery Plunk         PE: Gen: awake and alert. Acute distress. HEENT: The cervical lymphadenopathy, moist mucous membranes CV: Regular rate and rhythm, no murmurs rubs or gallop  WUJ:WJXBJY respiratory effort, clear to auscultation bilaterally Abd: Normal active  bowel sounds, soft, nontender Ext/Musc: No edema, 2+ pulses Neuro: Cranial nerves grossly intact  Labs/Studies:  Basic Metabolic Panel:    Component Value Date/Time   NA 141 02/20/2011 0853   K 5.1 02/20/2011 0853   CL 95* 02/20/2011 0853   CO2 33* 02/20/2011 0853   BUN 44* 02/20/2011 0853   CREATININE 6.29* 02/20/2011 0853   GLUCOSE 97 02/20/2011 0853   CALCIUM 8.9 02/20/2011 0853   CBC:    Component Value Date/Time   WBC 11.5* 02/20/2011 0857   HGB 10.0* 02/20/2011 0857   HCT 32.2* 02/20/2011 0857   PLT 216 02/20/2011 0857   MCV 91.7 02/20/2011 0857   NEUTROABS 9.3* 02/19/2011 1334   LYMPHSABS 0.7 02/19/2011 1334   MONOABS 0.6 02/19/2011 1334   EOSABS 0.0 02/19/2011 1334   BASOSABS 0.0 02/19/2011 1334     Assessment/Plan: Brenda Leach is a 77 y.o. year old female presenting with resolved nausea and vomiting   1. Nausea and vomiting-  patient with one day of nausea and vomiting. One bout of emesis this morning after attempting to take MiraLAX. No emesis reported with meals. Much improved. Symptoms likely due to resolved IBS flare versus viral gastroenteritis. Will give Zofran for any nausea.   2. FEN/GI: SLIV. May advance to regular diet this morning dvance as tolerated to renal diet.   3.  Renal: In renal failure. In dialysis this am.   4.  Anemia: Pt Hgb at 10 this am. Down from 11.8 at admission. Likely dilutional. Unsure of baseline. Will monitor for      now  5.  HTN: Blood pressure down significantly since admission. Likely in part to dialysis. Will DC olmesartan as I don't       want pt to become orthostatic.   6. Prophylaxis: Heparin subcutaneous   7. CODE STATUS -no CODE BLUE   8. Disposition: Likely DC today after dialysis.         Signed: Shelly Flatten, MD Family Medicine Resident PGY-1 02/20/2011 9:28 AM

## 2011-02-20 NOTE — Progress Notes (Signed)
Pt off unit

## 2011-02-23 NOTE — Discharge Summary (Signed)
Family Medicine Resident Discharge Summary  Patient ID: Brenda Leach [id number] 77 y.o. 1923-04-17  Admit date: 02/19/2011  Discharge date and time: 02/20/2011  4:50 PM   Admitting Physician: Tobin Chad   Discharge Physician: Oda Cogan, MD  Admission Diagnoses: Nausea and vomiting [787.0] Dehydration [276.51] Gastroenteritis  Discharge Diagnoses: Dehydration secondary to viral gastroenteritis vs IBS constipation type flare  Admission Condition: fair  Discharged Condition: good  Indication for Admission: Dehydration with nausea and vomiting in an elderly dialysis pt.   Hospital Course: [name] is an 77 year old African American female in end-stage renal disease who was admitted after acute onset nausea and vomiting for one day with dehydration.  Dehydration: Likely due to viral gastroenteritis. Patient given Zofran with relief of symptoms. Patient given IV fluids in the emergency room. Patient taking good by mouth at time of admission so IV fluids were not given. Patient was initially started on a clear liquid diet but was advanced as nausea improved. Patient's appetite and PO status at baseline at time of discharge. Patient's vital signs remained stable throughout hospitalization.   Symptoms possibly also do to IBS flare. Patient noted to have IBS constipation type. Patient continued on home MiraLAX regimen. Patient without bowel movement during brief hospitalization.  ESRD: The patient was dialyzed on 02/20/2011. Tolerated procedure well.   Prophylaxis: Patient given heparin 5000 units subcutaneously 3 times a day during hospitalization as DVT prophylaxis  Consults: nephrology  Significant Diagnostic Studies:  BUN/Cr: 44/6.29   Treatments: Dialysis  Discharge Exam: Gen: awake and alert. No acute distress.  HEENT: The cervical lymphadenopathy, moist mucous membranes  CV: Regular rate and rhythm, no murmurs rubs or gallop  BJY:NWGNFA respiratory effort, clear  to auscultation bilaterally  Abd: Normal active bowel sounds, soft, nontender  Ext/Musc: No edema, 2+ pulses  Neuro: Cranial nerves grossly intact   Disposition: home. Pt living w/ daughter. Pt just moved to area from [place]  Patient Instructions:  Discharge Medication List as of 02/20/2011  4:06 PM    START taking these medications   Details  ondansetron (ZOFRAN) 4 MG tablet Take 1 tablet (4 mg total) by mouth every 6 (six) hours as needed., Starting 02/20/2011, Until Thu 02/27/11, Print      CONTINUE these medications which have NOT CHANGED   Details  acetaminophen (TYLENOL) 325 MG tablet Take 650 mg by mouth every 6 (six) hours as needed. For pain, Until Discontinued, Historical Med    aspirin 81 MG tablet Take 81 mg by mouth daily. Except on dialysis days, Until Discontinued, Historical Med    metoprolol (LOPRESSOR) 50 MG tablet Take 50 mg by mouth 2 (two) times daily.  , Until Discontinued, Historical Med    NIFEdipine (PROCARDIA-XL/ADALAT CC) 60 MG 24 hr tablet Take 60 mg by mouth daily.  , Until Discontinued, Historical Med    polyethylene glycol (MIRALAX / GLYCOLAX) packet Take 17 g by mouth daily.  , Until Discontinued, Historical Med       Activity: activity as tolerated Diet: renal diet Wound Care: none needed  Follow-up with [facility] on Wednesday 02/26/11  Follow-up Items: 1. Unintentional weight loss 2.    Signed: Shelly Flatten, MD Family Medicine Resident PGY-1 02/23/2011 1:31 AM

## 2011-02-23 NOTE — Discharge Summary (Signed)
I reviewed and agree with this discharge summary.

## 2011-02-26 ENCOUNTER — Other Ambulatory Visit: Payer: Self-pay | Admitting: Internal Medicine

## 2011-02-26 DIAGNOSIS — Z780 Asymptomatic menopausal state: Secondary | ICD-10-CM

## 2011-03-03 ENCOUNTER — Ambulatory Visit
Admission: RE | Admit: 2011-03-03 | Discharge: 2011-03-03 | Disposition: A | Discharge status: Home / Self Care | Payer: Medicare Other | Source: Ambulatory Visit | Attending: Internal Medicine | Admitting: Internal Medicine

## 2011-03-03 ENCOUNTER — Other Ambulatory Visit: Payer: Self-pay | Admitting: Internal Medicine

## 2011-03-03 DIAGNOSIS — Z780 Asymptomatic menopausal state: Secondary | ICD-10-CM

## 2011-03-07 ENCOUNTER — Other Ambulatory Visit: Payer: Self-pay | Admitting: Nephrology

## 2011-03-07 ENCOUNTER — Ambulatory Visit
Admission: RE | Admit: 2011-03-07 | Discharge: 2011-03-07 | Disposition: A | Discharge status: Home / Self Care | Payer: Medicare Other | Source: Ambulatory Visit | Attending: Nephrology | Admitting: Nephrology

## 2011-03-07 DIAGNOSIS — R05 Cough: Secondary | ICD-10-CM

## 2011-03-12 ENCOUNTER — Encounter: Payer: Self-pay | Admitting: Gastroenterology

## 2011-03-14 NOTE — Consult Note (Signed)
Brenda Leach  

## 2011-03-27 ENCOUNTER — Encounter: Payer: Self-pay | Admitting: Internal Medicine

## 2011-03-31 ENCOUNTER — Ambulatory Visit: Payer: Medicare Other | Admitting: Gastroenterology

## 2011-04-02 ENCOUNTER — Other Ambulatory Visit (INDEPENDENT_AMBULATORY_CARE_PROVIDER_SITE_OTHER): Payer: Medicare Other

## 2011-04-02 ENCOUNTER — Other Ambulatory Visit: Payer: Medicare Other

## 2011-04-02 ENCOUNTER — Ambulatory Visit (INDEPENDENT_AMBULATORY_CARE_PROVIDER_SITE_OTHER): Payer: Medicare Other | Admitting: Internal Medicine

## 2011-04-02 ENCOUNTER — Encounter: Payer: Self-pay | Admitting: Internal Medicine

## 2011-04-02 DIAGNOSIS — R1030 Lower abdominal pain, unspecified: Secondary | ICD-10-CM | POA: Insufficient documentation

## 2011-04-02 DIAGNOSIS — R109 Unspecified abdominal pain: Secondary | ICD-10-CM

## 2011-04-02 DIAGNOSIS — J4550 Severe persistent asthma, uncomplicated: Secondary | ICD-10-CM | POA: Insufficient documentation

## 2011-04-02 DIAGNOSIS — K5900 Constipation, unspecified: Secondary | ICD-10-CM

## 2011-04-02 DIAGNOSIS — J449 Chronic obstructive pulmonary disease, unspecified: Secondary | ICD-10-CM

## 2011-04-02 DIAGNOSIS — K219 Gastro-esophageal reflux disease without esophagitis: Secondary | ICD-10-CM

## 2011-04-02 MED ORDER — POLYETHYLENE GLYCOL 3350 17 G PO PACK: 17.0000 g | PACK | Freq: Two times a day (BID) | ORAL | Status: AC | PRN

## 2011-04-02 NOTE — Patient Instructions (Addendum)
We have sent the following medications to your pharmacy for you to pick up at your convenience.  Dr. Rhea Belton would like you to use Tylenol as needed for pain.  CT-scan of abdomen and pelvis at [facility] on [facility].  On Friday 04/04/2011 @ 1:30pm Please arrive at 1:15pm Nothing to eat or drink 4 hours prior to your CT  Dr. Rhea Belton could like you to follow up in 4 weeks  Your physician has requested that you go to the basement for the following lab work before leaving today: TSH    .

## 2011-04-02 NOTE — Progress Notes (Signed)
Subjective:    Patient ID: Brenda Leach, female    DOB: [birth year], 78 y.o.   MRN: [id number]  HPI Ms. Christian is an 78 yo female with PMH of end-stage renal disease on hemodialysis, GERD, hypertension, COPD, colon polyps who seen in consultation at the request of Dr. Glade Lloyd for evaluation of lower abd pain.  The patient states that she has an intense staining and burning lower abdominal pain. This started around March of 2012, though she does remember it being somewhat intermittent prior to that. She reports this pain does not relate to eating, but she does related to bowel movement. She reports this is worse when she is constipated. Initially this pain improved after defecation though now it is less affected by bowel movement. This pain is specifically worse at night, and specifically worse when lying down. The pain does not radiate. There was a time she was having pressure in her lower back and left hip, but these problems have remitted. She does report ongoing trouble with constipation, and notes she will have a bowel movement once per week if she's not using medication. She uses MiraLAX 17 g daily the prior was taking it twice daily. Her constipation was better when she was using it twice daily. She reports approximate 50 pound weight loss in 1 years time. She reports her appetite is "not too bad" but she does report some early satiety. She denies dysphagia and odynophagia. Heartburn is not a big issue for her. She's not having nausea or vomiting.  She had a workup of this abdominal pain done by a gastroenterologist in [place] in early 2012. She underwent upper endoscopy and colonoscopy. When these were unremarkable other than colon polyps, a CT scan was done also without revealing the cause of her symptoms. She subsequently moved to [place] to be close to family in November.  No fevers or chills.  Review of Systems Constitutional: Negative for fever, chills, night sweats HEENT: Negative  for sore throat, mouth sores and trouble swallowing. Eyes: Negative for visual disturbance Respiratory: Positive for cough and shortness of breath Cardiovascular: Negative for chest pain, palpitations and lower extremity swelling Gastrointestinal: See history of present illness Genitourinary: Negative for dysuria and hematuria. Musculoskeletal: Positive for back pain, positive arthralgias and negative myalgias Skin: Negative for rash or color change Neurological: Negative for headaches, weakness, numbness Hematological: Negative for adenopathy, negative for easy bruising/bleeding Psychiatric/behavioral: Negative for depressed mood, positive for anxiety   Past Medical History  Diagnosis Date  . Hypertension   . Asthma   . IBS (irritable bowel syndrome)   . Shortness of breath   . Blood transfusion   . GERD (gastroesophageal reflux disease)   . Arthritis   . Vitamin D deficiency   . Osteoporosis   . Abdominal pain, epigastric   . Extrinsic asthma, unspecified   . Chronic airway obstruction, not elsewhere classified   . Chronic kidney disease, stage IV (severe)   . Insomnia, unspecified   . Other malaise and fatigue   . Anemia     hx of   . Anxiety   . Hx of colonic polyps    Past Surgical History  Procedure Date  . Abdominal hysterectomy   . Neck surgery   . Tumor removal   . Throat surgery    Current Outpatient Prescriptions  Medication Sig Dispense Refill  . acetaminophen (TYLENOL) 325 MG tablet Take 650 mg by mouth every 6 (six) hours as needed. For pain      .  albuterol (PROVENTIL HFA;VENTOLIN HFA) 108 (90 BASE) MCG/ACT inhaler Inhale 2 puffs into the lungs every 6 (six) hours as needed.      Marland Kitchen aspirin 81 MG tablet Take 81 mg by mouth daily. Except on dialysis days      . B Complex Vitamins (B COMPLEX 100 PO) Take by mouth. Does not take all the time      . B Complex-C-Folic Acid (RENAL SOFTGELS PO) Take by mouth as directed.      . budesonide (PULMICORT) 0.5 MG/2ML  nebulizer solution Take 0.5 mg by nebulization 2 (two) times daily.      . fluticasone (FLOVENT DISKUS) 50 MCG/BLIST diskus inhaler Inhale 1 puff into the lungs as directed.       . iron polysaccharides (NIFEREX 60) 40-20 MG capsule Take 1 capsule by mouth 2 (two) times daily with a meal.      . lanthanum (FOSRENOL) 500 MG chewable tablet Chew 500 mg by mouth 2 (two) times daily with a meal.      . metoprolol (LOPRESSOR) 50 MG tablet Take 50 mg by mouth 2 (two) times daily.        . montelukast (SINGULAIR) 10 MG tablet Take 10 mg by mouth at bedtime.      Marland Kitchen omeprazole (PRILOSEC) 20 MG capsule Take 20 mg by mouth daily.      . polyethylene glycol (MIRALAX / GLYCOLAX) packet Take 17 g by mouth as directed.       . telmisartan (MICARDIS) 80 MG tablet Take 80 mg by mouth daily.      . Vitamin D, Ergocalciferol, (DRISDOL) 50000 UNITS CAPS Take 50,000 Units by mouth every 7 (seven) days.      . polyethylene glycol (MIRALAX / GLYCOLAX) packet Take 17 g by mouth 2 (two) times daily as needed.  14 each  0   Allergies  Allergen Reactions  . 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  PT STATES SHE CAN TAKE TYLENOL WITHOUT ANY PROBLEMS.    Family History  Problem Relation Age of Onset  . Liver cancer Father   . Stroke Mother   . Colon cancer Neg Hx   . Diabetes Brother   . Heart disease Sister   . Liver disease Father     Social History  . Marital Status: Widowed    Number of Children: 2   Occupational History  . Retired    Social History Main Topics  . Smoking status: Never Smoker   . Smokeless tobacco: Never Used  . Alcohol Use: No  . Drug Use: No  --See HPI     Objective:   Physical Exam BP 150/80  Pulse 70  Ht 5' (1.524 m)  Wt 107 lb (48.535 kg)  BMI 20.90 kg/m2 Constitutional: Well-developed and well-nourished. No distress. HEENT: Normocephalic and atraumatic. Oropharynx is clear and moist. No oropharyngeal exudate.  Conjunctivae are normal. Pupils are equal round and reactive to light. No scleral icterus. Neck: Neck supple. Trachea midline. Cardiovascular: Normal rate, regular rhythm and intact distal pulses. 2/6 systolic ejection murmur Pulmonary/chest: Tachypnea with shallow inspiration otherwise clear Abdominal: Soft, diffuse abdominal tenderness worse across the lower abdomen without rebound or guarding, thin, nondistended. Bowel sounds active throughout. There are no masses palpable. No hepatosplenomegaly. Extremities: no clubbing, cyanosis, or edema Lymphadenopathy: No cervical adenopathy noted. Neurological: Alert and oriented to person place and time. Skin: Skin is warm and dry. No rashes noted. Psychiatric: Normal mood and  affect. Behavior is normal.  Records Reviewed:  EGD 07/10/2010 -- normal  Colonoscopy 06/25/2010 -- in the cecum there was a smooth submucosal nodule that was soft. It was not removed. The cecal cap appeared normal. The ileocecal valve also appeared to be normal. In the distal ascending colon a small sessile polyp was noted and removed using standard snare polypectomy. In the hepatic flexure another 1 cm sessile polyp was noted and removed using snare polypectomy. The rest of the transverse and descending colon appeared normal. The sigmoid colon was redundant. In the sigmoid colon a small sessile polyp was noted and removed using standard snare polypectomy. The rectum appeared normal. External hemorrhoids were noted. Pathology: Fragmented hyperplastic polyps  CT scan abdomen and pelvis 07/25/2010 -- without contrast. No colitis, diverticulitis or ascites. Extensive atherosclerotic disease involving abdominal aorta and iliac arteries. No obstruction. Marked fecal residue present    Assessment & Plan:  Ms. Ramanathan is an 78 yo female with PMH of end-stage renal disease on hemodialysis, GERD, hypertension, COPD, colon polyps who seen in consultation at the request of Dr. Glade Lloyd for  evaluation of lower abd pain.  1. Lower abd pain -- the patient's lower abdominal pain has been persistent for months now, and she did have prior workup with both upper and lower endoscopy both of which were unremarkable as to etiology for her pain.  She seems to relate her pain most to constipation, and this should be a relatively easy fix. I have advised that she return to taking MiraLAX 17 g twice daily. This will be in an attempt to avoid constipated periods. I also will give her lactulose which he can use as needed for constipation not improved by MiraLAX. I am not convinced that her pain is solely related to constipation, and given her weight loss I feel that further evaluation is needed, particularly as to possible mesenteric ischemia. Her previous CT scan was non-contrasted and I would like to perform a CT abdomen angiography to evaluate for possible critical mesenteric ischemia. I will also order a TSH. I've advised that she use Tylenol as needed for the abdominal burning. She states this has worked in the past. I'll see her back in 4 weeks' time to reassess her symptoms.

## 2011-04-04 ENCOUNTER — Other Ambulatory Visit: Payer: Medicare Other

## 2011-04-04 ENCOUNTER — Telehealth: Payer: Self-pay | Admitting: *Deleted

## 2011-04-04 NOTE — Telephone Encounter (Signed)
Spoke with pt's daughter, Dub Mikes, to inform her pt's thyroid is normal; Rose stated understanding.

## 2011-04-04 NOTE — Telephone Encounter (Signed)
Message copied by Florene Glen on Fri Apr 04, 2011  2:53 PM ------      Message from: Beverley Fiedler      Created: Fri Apr 04, 2011 10:06 AM       Normal thyroid.

## 2011-04-04 NOTE — Telephone Encounter (Signed)
lmom for pt to call back

## 2011-04-09 ENCOUNTER — Institutional Professional Consult (permissible substitution): Payer: Medicare Other | Admitting: Internal Medicine

## 2011-04-11 ENCOUNTER — Encounter: Payer: Self-pay | Admitting: Internal Medicine

## 2011-04-11 ENCOUNTER — Ambulatory Visit (INDEPENDENT_AMBULATORY_CARE_PROVIDER_SITE_OTHER)
Admission: RE | Admit: 2011-04-11 | Discharge: 2011-04-11 | Disposition: A | Discharge status: Home / Self Care | Payer: Medicare Other | Source: Ambulatory Visit | Attending: Internal Medicine | Admitting: Internal Medicine

## 2011-04-11 ENCOUNTER — Ambulatory Visit: Payer: Medicare Other

## 2011-04-11 ENCOUNTER — Ambulatory Visit (INDEPENDENT_AMBULATORY_CARE_PROVIDER_SITE_OTHER): Payer: Medicare Other | Admitting: Internal Medicine

## 2011-04-11 VITALS — BP 200/110 | HR 81 | Temp 97.4°F | Ht 63.0 in | Wt 104.0 lb

## 2011-04-11 DIAGNOSIS — R05 Cough: Secondary | ICD-10-CM

## 2011-04-11 DIAGNOSIS — J449 Chronic obstructive pulmonary disease, unspecified: Secondary | ICD-10-CM

## 2011-04-11 DIAGNOSIS — I10 Essential (primary) hypertension: Secondary | ICD-10-CM

## 2011-04-11 DIAGNOSIS — K5900 Constipation, unspecified: Secondary | ICD-10-CM

## 2011-04-11 MED ORDER — OMEPRAZOLE 20 MG PO CPDR: DELAYED_RELEASE_CAPSULE | ORAL | Status: DC

## 2011-04-11 MED ORDER — IOHEXOL 350 MG/ML SOLN
100.0000 mL | Freq: Once | INTRAVENOUS | Status: AC | PRN
  Administered 2011-04-11: 80 mL via INTRAVENOUS

## 2011-04-11 MED ORDER — FAMOTIDINE 20 MG PO TABS: ORAL_TABLET | ORAL | Status: DC

## 2011-04-11 NOTE — Patient Instructions (Addendum)
Please see patient coordinator before you leave today  to schedule sinus ct and we will call results Prilosec (omeprazole)  20 mg Take 30-60 min before first meal of the day  Pepcid ac 20mg  one at bedtime (over the counter) Hold off on fosfamax for now Stop lopressor Start Bystolic 20 mg today and daily Use Mucinex dm as needed for cough/ congestion (over the counter) Work on inhaler technique:  relax and gently blow all the way out then take a nice smooth deep breath back in, triggering the inhaler at same time you start breathing in.  Hold for up to 5 seconds if you can.  Rinse and gargle with water when done   If your mouth or throat starts to bother you,   I suggest you time the inhaler to your dental care and after using the inhaler(s) brush teeth and tongue with a baking soda containing toothpaste and when you rinse this out, gargle with it first to see if this helps your mouth and throat.     GERD (REFLUX)  is an extremely common cause of respiratory symptoms, many times with no significant heartburn at all.    It can be treated with medication, but also with lifestyle changes including avoidance of late meals, excessive alcohol, smoking cessation, and avoid fatty foods, chocolate, peppermint, colas, red wine, and acidic juices such as orange juice.  NO MINT OR MENTHOL PRODUCTS SO NO COUGH DROPS  USE SUGARLESS CANDY INSTEAD (jolley ranchers or Stover's)  NO OIL BASED VITAMINS - use powdered substitutes.    See Tammy NP w/in next 2 weeks with all your medications, even over the counter meds, separated in two separate bags, the ones you take no matter what vs the ones you stop once you feel better and take only as needed when you feel you need them.   Tammy  will generate for you a new user friendly medication calendar that will put [place] all on the same page re: your medication use.     Without this process, it simply isn't possible to assure that we are providing  your outpatient care  with   the attention to detail we feel you deserve.   If we cannot assure that you're getting that kind of care,  then we cannot manage your problem effectively from this clinic.  Once you have seen Tammy and we are sure that we're all on the same page with your medication use she will arrange follow up with me.

## 2011-04-11 NOTE — Progress Notes (Signed)
  Subjective:    Patient ID: Brenda Leach, female    DOB: [birth year]    MRN: [id number]  HPI  35 yobf never smoker esrf 2004 started HD  with episodes of sob and cough since around 2006 onset while living  in [place] moved to [place] 1st of November 2012 with daily symptoms since around winter 2012 referred 04/11/2011 by Dr Briant Cedar.  04/11/2011 1st pulmonary ov in [place] p pulmonary doctor in [place]  2012 "dx scar lung tissue" with daily symptoms of cough and sob (usually both linked) x one year worse first thing in am and also when lie down with sev tbsp each am of mucoid sputum. Better p use of saba but technique poor (see below). Denies ever being told she had sinus dz or bronchiectasis, just "scar tissue".  Denies dysphagia or overt HB.  Sleeping ok without nocturnal  Awakening with any respiratory  c/o's or need for noct saba. Also denies any obvious fluctuation of symptoms with weather or environmental changes or other aggravating or alleviating factors except as outlined above. No fluctuation of symptoms with HD. Mild chronic doe x years > sob not really an issue unless having one of her coughing spells  Very vague on details of care and meds in terms of names and how she takes them.   Review of Systems  Constitutional: Positive for unexpected weight change. Negative for fever and chills.  HENT: Positive for ear pain, congestion and sore throat. Negative for nosebleeds, rhinorrhea, sneezing, trouble swallowing, dental problem, voice change, postnasal drip and sinus pressure.   Eyes: Negative for visual disturbance.  Respiratory: Positive for cough and shortness of breath. Negative for choking.   Cardiovascular: Negative for chest pain and leg swelling.  Gastrointestinal: Positive for abdominal pain. Negative for vomiting and diarrhea.  Genitourinary: Negative for difficulty urinating.  Musculoskeletal: Negative for arthralgias.  Skin: Negative for rash.  Neurological: Negative for tremors, syncope  and headaches.  Hematological: Does not bruise/bleed easily.       Objective:   Physical Exam amb bf about stated age  Wt 89  04/11/11  HEENT: nl dentition, turbinates, and orophanx. Nl external ear canals without cough reflex   NECK :  without JVD/Nodes/TM/ nl carotid upstrokes bilaterally   LUNGS: no acc muscle use, clear to A and P bilaterally without cough on insp or exp maneuvers   CV:  RRR  no s3 or murmur or increase in P2, no edema   ABD:  soft and nontender with nl excursion in the supine position. No bruits or organomegaly, bowel sounds nl  MS:  warm without deformities, calf tenderness, cyanosis or clubbing  SKIN: warm and dry without lesions    NEURO:  alert, approp, no deficits    cxr 03/07/11 Mild stable emphysematous changes but no acute pulmonary findings       Assessment & Plan:

## 2011-04-13 ENCOUNTER — Encounter: Payer: Self-pay | Admitting: Internal Medicine

## 2011-04-13 NOTE — Assessment & Plan Note (Signed)
The most common causes of chronic cough in immunocompetent adults include the following: upper airway cough syndrome (UACS), previously referred to as postnasal drip syndrome (PNDS), which is caused by variety of rhinosinus conditions; (2) asthma; (3) GERD; (4) chronic bronchitis from cigarette smoking or other inhaled environmental irritants; (5) nonasthmatic eosinophilic bronchitis; and (6) bronchiectasis.   These conditions, singly or in combination, have accounted for up to 94% of the causes of chronic cough in prospective studies.   Other conditions have constituted no >6% of the causes in prospective studies These have included bronchogenic carcinoma, chronic interstitial pneumonia, sarcoidosis, left ventricular failure, ACEI-induced cough, and aspiration from a condition associated with pharyngeal dysfunction.   This pattern is most consistent with  Classic Upper airway cough syndrome, so named because it's frequently impossible to sort out how much is  CR/sinusitis with freq throat clearing (which can be related to primary GERD)   vs  causing  secondary (" extra esophageal")  GERD from wide swings in gastric pressure that occur with throat clearing, often  promoting self use of mint and menthol lozenges that reduce the lower esophageal sphincter tone and exacerbate the problem further in a cyclical fashion.   These are the same pts who not infrequently have failed to tolerate ace inhibitors,  dry powder inhalers or biphosphonates or report having reflux symptoms that don't respond to standard doses of PPI , and are easily confused as having aecopd or asthma flares.  Her sinus CT does not support sinusitis as mech so rec max gerd rx then return in two weeks to regroup with meds and do meaningful medication reconciliation then go from there.

## 2011-04-13 NOTE — Assessment & Plan Note (Signed)
Strongly prefer in the  Setting of pulmonary symptoms of unknown etiology: Bystolic, the most beta -1  selective Beta blocker available in sample form, with bisoprolol the most selective generic choice  on the market.

## 2011-04-13 NOTE — Assessment & Plan Note (Addendum)
-   Never smoker so this dx may not be tenable    - HFA 25% at baseline 04/11/11  The proper method of use, as well as anticipated side effects, of this metered-dose inhaler are discussed and demonstrated to the patient. Improved to 50% at best and if not improved on return consider change to nebulized perfomist since already on pulmicort(though not really clear at this point it's needed here)  Ideally needs pft's once the cough is addressed.

## 2011-04-14 ENCOUNTER — Telehealth: Payer: Self-pay | Admitting: *Deleted

## 2011-04-14 NOTE — Telephone Encounter (Signed)
Message copied by Florene Glen on Mon Apr 14, 2011  9:51 AM ------      Message from: Beverley Fiedler      Created: Fri Apr 11, 2011  1:56 PM       Result reviewed and she does have extensive atherosclerosis.  However no definite narrowing causing significant restriction of blood flow was found. Therefore, they do not see a narrowing at which intervention may help her. She did have moderate stool throughout her colon and therefore continuing to work on her constipation may improve her burning pain. They also noted a T12 compression fracture, and I'm not sure she knows about this. This could cause significant back pain and if she did not know about this it could be evaluated by orthopedics      I'll see her in followup as per the note

## 2011-04-14 NOTE — Telephone Encounter (Signed)
Informed pt's daughter, Dub Mikes, who asked that I email Dr Lauro Franklin comments to her and faxed the scans to Dr Glade Lloyd at [phone].

## 2011-04-15 NOTE — Progress Notes (Signed)
Quick Note:  Spoke with the pt's daughter and notified of results per MW. She verbalized understanding and states will inform the pt. ______

## 2011-04-25 ENCOUNTER — Encounter: Payer: Self-pay | Admitting: Adult Health

## 2011-04-25 ENCOUNTER — Ambulatory Visit (INDEPENDENT_AMBULATORY_CARE_PROVIDER_SITE_OTHER): Payer: Medicare Other | Admitting: Adult Health

## 2011-04-25 VITALS — BP 140/80 | HR 63 | Temp 96.7°F | Ht 63.0 in | Wt 106.8 lb

## 2011-04-25 DIAGNOSIS — R0609 Other forms of dyspnea: Secondary | ICD-10-CM

## 2011-04-25 DIAGNOSIS — R0600 Dyspnea, unspecified: Secondary | ICD-10-CM | POA: Insufficient documentation

## 2011-04-25 MED ORDER — ACETAMINOPHEN 325 MG PO TABS: ORAL_TABLET | ORAL | Status: DC

## 2011-04-25 MED ORDER — RENAL SOFTGELS 1 MG PO CAPS: 1.0000 | ORAL_CAPSULE | Freq: Every day | ORAL | Status: AC

## 2011-04-25 MED ORDER — NEBIVOLOL HCL 20 MG PO TABS: 1.0000 | ORAL_TABLET | Freq: Every day | ORAL | Status: DC

## 2011-04-25 MED ORDER — POLYETHYLENE GLYCOL 3350 17 G PO PACK: PACK | ORAL | Status: DC

## 2011-04-25 MED ORDER — ALBUTEROL SULFATE HFA 108 (90 BASE) MCG/ACT IN AERS: 2.0000 | INHALATION_SPRAY | RESPIRATORY_TRACT | Status: AC | PRN

## 2011-04-25 MED ORDER — DEXTROMETHORPHAN-GUAIFENESIN 5-100 MG/5ML PO LIQD: ORAL | Status: DC

## 2011-04-25 MED ORDER — TELMISARTAN 80 MG PO TABS: 80.0000 mg | ORAL_TABLET | Freq: Every day | ORAL | Status: AC

## 2011-04-25 MED ORDER — DIPHENHYDRAMINE HCL 25 MG PO CAPS: ORAL_CAPSULE | ORAL | Status: DC

## 2011-04-25 MED ORDER — FLUTICASONE PROPIONATE 50 MCG/ACT NA SUSP: 2.0000 | Freq: Two times a day (BID) | NASAL | Status: DC

## 2011-04-25 MED ORDER — LANTHANUM CARBONATE 500 MG PO CHEW: 500.0000 mg | CHEWABLE_TABLET | Freq: Every day | ORAL | Status: DC

## 2011-04-25 NOTE — Progress Notes (Signed)
  Subjective:    Patient ID: Brenda Leach, female    DOB: [birth year]    MRN: [id number]  HPI 45 yobf never smoker esrf 2004 started HD  with episodes of sob and cough since around 2006 onset while living  in [place] moved to [place] 1st of November 2012 with daily symptoms since around winter 2012 referred 04/11/2011 by Dr Briant Cedar.  04/11/2011 1st pulmonary ov in [place] p pulmonary doctor in [place]  2012 "dx scar lung tissue" with daily symptoms of cough and sob (usually both linked) x one year worse first thing in am and also when lie down with sev tbsp each am of mucoid sputum. Better p use of saba but technique poor (see below). Denies ever being told she had sinus dz or bronchiectasis, just "scar tissue".  Denies dysphagia or overt HB. >CT sinus, changed lopressor to bystolic and fosamax held. PPI /Pepcid rec   04/25/2011 Follow up and med review  Pt returns for follow up and med review. We reviewed all his meds and organized them into a med calendar. It appears he is taking his meds correctly.  Last ov he was changed from lopressor to bystolic . Prilosec and pepcid were added and Fosamax was held.  She is feeling better, more active. Decreased dyspnea.    Review of Systems  Constitutional:   No  weight loss, night sweats,  Fevers, chills,  +fatigue, or  lassitude.  HEENT:   No headaches,  Difficulty swallowing,  Tooth/dental problems, or  Sore throat,                No sneezing, itching, ear ache, nasal congestion, post nasal drip,   CV:  No chest pain,  Orthopnea, PND, swelling in lower extremities, anasarca, dizziness, palpitations, syncope.   GI  No heartburn, indigestion, abdominal pain, nausea, vomiting, diarrhea, change in bowel habits, loss of appetite, bloody stools.   Resp  No excess mucus, no productive cough,  No non-productive cough,  No coughing up of blood.  No change in color of mucus.  No wheezing.  No chest wall deformity  Skin: no rash or lesions.  GU: no dysuria, change in color  of urine, no urgency or frequency.  No flank pain, no hematuria   MS:  No joint pain or swelling.  No decreased range of motion.   Psych:  No change in mood or affect. No depression or anxiety.  No memory loss.         Objective:   Physical Exam amb bf about stated age  [place] 89  04/11/11  >>106 04/25/2011   HEENT: nl dentition, turbinates, and orophanx. Nl external ear canals without cough reflex   NECK :  without JVD/Nodes/TM/ nl carotid upstrokes bilaterally   LUNGS: no acc muscle use, clear to A and P bilaterally without cough on insp or exp maneuvers   CV:  RRR  no s3 or murmur or increase in P2, no edema  Left arm graft-Dialysis   ABD:  soft and nontender with nl excursion in the supine position. No bruits or organomegaly, bowel sounds nl  MS:  warm without deformities, calf tenderness, cyanosis or clubbing  SKIN: warm and dry without lesions    NEURO:  alert, approp, no deficits    cxr 03/07/11 Mild stable emphysematous changes but no acute pulmonary findings       Assessment & Plan:

## 2011-04-25 NOTE — Progress Notes (Signed)
Addended by: Boone Master E on: 04/25/2011 03:54 PM   Modules accepted: Orders

## 2011-04-25 NOTE — Assessment & Plan Note (Addendum)
Suspect multifactoral in nature with underlying Chronic renal failure on Dialysis CXR and Ct sinus unrevealing.  Improved off non selective beta blocker  Patient's medications were reviewed today and patient education was given. Computerized medication calendar was adjusted/completed  Plan:  Cont on current regimen  follow up in 4 weeks with PFT

## 2011-04-25 NOTE — Patient Instructions (Addendum)
Follow med calendar closely and bring to each visit.  Continue on current regimen.  follow up Dr. Sherene Sires  In 4 weeks with PFT and As needed   Please contact office for sooner follow up if symptoms do not improve or worsen or seek emergency care

## 2011-05-01 ENCOUNTER — Encounter: Payer: Self-pay | Admitting: Internal Medicine

## 2011-05-05 ENCOUNTER — Ambulatory Visit (INDEPENDENT_AMBULATORY_CARE_PROVIDER_SITE_OTHER): Payer: Medicare Other | Admitting: Internal Medicine

## 2011-05-05 ENCOUNTER — Encounter: Payer: Self-pay | Admitting: Internal Medicine

## 2011-05-05 DIAGNOSIS — R109 Unspecified abdominal pain: Secondary | ICD-10-CM

## 2011-05-05 DIAGNOSIS — R1030 Lower abdominal pain, unspecified: Secondary | ICD-10-CM

## 2011-05-05 DIAGNOSIS — K5900 Constipation, unspecified: Secondary | ICD-10-CM | POA: Insufficient documentation

## 2011-05-05 NOTE — Patient Instructions (Signed)
You can start taking Mirilax as needed up to but not more than 3 times a day.  Follow up as needed

## 2011-05-05 NOTE — Progress Notes (Signed)
Subjective:    Patient ID: Brenda Leach, female    DOB: February 08, 1924, 78 y.o.   MRN: [id number]  HPI 78 yo female with PMH of end-stage renal disease on hemodialysis, GERD, hypertension, COPD, colon polyps who seen in followup after being seen in January 2013 for lower abdominal burning pain. At her last visit, it was felt that possible mesenteric ischemia or low blood flow state to her bowel could have been contributing to her burning abdominal pain, however a CT angiography of the abdomen was unrevealing for mesenteric ischemia. It is also hypothesized, that constipation may been contributing to some of her burning pain. The decision was made to start MiraLAX 17 g twice daily, and she reports with this medication improvement and regular bowel habits. She also notes that the burning abdominal pain has completely resolved. She feels that this is more due to to the use of mineral oil and hot water, which she took one time since last seeing me. She continue with the MiraLAX twice daily until one week ago. She is not using it currently, but reports her bowel habits continue to be regular and without abdominal pain. She's having about one stool per day without blood or melena. Appetite is good, and she's gained 3 or 4 pounds since coming to live with her daughter locally. She reports her son-in-law is a good cook and she is enjoying eating his food.  Review of Systems As per HPI, otherwise unremarkable except for occasional low back pain  Past Medical History  Diagnosis Date  . Hypertension   . Asthma   . IBS (irritable bowel syndrome)   . Shortness of breath   . Blood transfusion   . GERD (gastroesophageal reflux disease)   . Arthritis   . Vitamin d deficiency   . Osteoporosis   . Abdominal pain, epigastric   . Extrinsic asthma, unspecified   . Chronic airway obstruction, not elsewhere classified   . Chronic kidney disease, stage IV (severe)   . Insomnia, unspecified   . Other malaise and  fatigue   . Anemia     hx of   . Anxiety   . Hx of colonic polyps    Past Surgical History  Procedure Date  . Abdominal hysterectomy   . Neck surgery   . Tumor removal   . Throat surgery    Current Outpatient Prescriptions  Medication Sig Dispense Refill  . acetaminophen (TYLENOL) 325 MG tablet Per bottle      . albuterol (PROAIR HFA) 108 (90 BASE) MCG/ACT inhaler Inhale 2 puffs into the lungs every 4 (four) hours as needed for wheezing or shortness of breath.      Marland Kitchen aspirin 81 MG tablet Take 81 mg by mouth daily. Except on dialysis days      . B Complex-C-Folic Acid (RENAL SOFTGELS) 1 MG CAPS Take 1 capsule (1 mg total) by mouth daily.      . budesonide (PULMICORT) 0.5 MG/2ML nebulizer solution Take 0.5 mg by nebulization 2 (two) times daily.      . Cyanocobalamin (B-12) 500 MCG TABS Take by mouth as needed.      Marland Kitchen Dextromethorphan-Guaifenesin (MUCINEX FAST-MAX DM MAX) 5-100 MG/5ML LIQD 1 tsp every 12 hours as needed for cough/congestion      . diphenhydrAMINE (BENADRYL) 25 mg capsule Per bottle      . docusate sodium (COLACE) 100 MG capsule Take 100 mg by mouth 2 (two) times daily.      . famotidine (PEPCID) 20  MG tablet One at bedtime      . fluticasone (FLONASE) 50 MCG/ACT nasal spray Place 2 sprays into the nose 2 (two) times daily.      . hydrALAZINE (APRESOLINE) 25 MG tablet Take 1 tablet by mouth 4 (four) times daily.      Marland Kitchen lanthanum (FOSRENOL) 500 MG chewable tablet Chew 1 tablet (500 mg total) by mouth daily.      . montelukast (SINGULAIR) 10 MG tablet Take 10 mg by mouth at bedtime.      . Nebivolol HCl (BYSTOLIC) 20 MG TABS Take 1 tablet (20 mg total) by mouth daily.  30 tablet  5  . NIFEdipine (PROCARDIA XL/ADALAT-CC) 60 MG 24 hr tablet Take 60 mg by mouth 2 (two) times daily.      Marland Kitchen omeprazole (PRILOSEC) 20 MG capsule Take 30-60 min before first meal of the day  30 capsule    . polyethylene glycol (MIRALAX / GLYCOLAX) packet 1 packet daily as needed      . telmisartan  (MICARDIS) 80 MG tablet Take 1 tablet (80 mg total) by mouth at bedtime.      . Vitamin D, Ergocalciferol, (DRISDOL) 50000 UNITS CAPS Take 50,000 Units by mouth every 7 (seven) days.       Allergies  Allergen Reactions  . 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  PT STATES SHE CAN TAKE TYLENOL WITHOUT ANY PROBLEMS.    Family History  Problem Relation Age of Onset  . Liver cancer Father   . Stroke Mother   . Colon cancer Neg Hx   . Diabetes Brother   . Heart disease Sister   . Liver disease Father    SH - reviewed and no change    Objective:   Physical Exam BP 146/60  Pulse 60  Ht 5\' 3"  (1.6 m)  Wt 108 lb (48.988 kg)  BMI 19.13 kg/m2 Constitutional: Well-developed and well-nourished. No distress. Cardiovascular: Normal rate, regular rhythm and intact distal pulses. No M/R/G Pulmonary/chest: Effort normal and breath sounds normal. No wheezing, rales or rhonchi. Abdominal: Soft, nontender, nondistended. Bowel sounds active throughout. There are no masses palpable. No hepatosplenomegaly. Extremities: no clubbing, cyanosis, or edema Neurological: Alert and oriented to person place and time. Psychiatric: Normal mood and affect. Behavior is normal.  CT ANGIOGRAPHY ABDOMEN AND PELVIS 04/02/2011   Technique:  Multidetector CT imaging of the abdomen and pelvis using the standard protocol during bolus administration of intravenous contrast. Multiplanar reconstructed images obtained and reviewed to evaluate the vascular anatomy.   Contrast: 80 ml Omnipaque 350 IV   Comparison: None    Findings: Abdominal aorta:  Ectatic with moderate diffuse calcified plaque; no dissection, aneurysm, or stenosis.   Celiac axis:  Partially calcified nonocclusive ostial plaque, widely patent distally   Superior mesenteric artery:  Partially calcified nonocclusive ostial plaque, with tandem   calcified plaque more distally without significant  stenosis.  Replaced right hepatic arterial supply from the SMA, an anatomic variant.   Left renal artery:  Calcified ostial plaque resulting in at least mild stenosis, patent distally   Right renal artery:  Partially calcified ostial plaque resulting at least mild short segment stenosis, patent distally   Inferior mesenteric artery:  Patent, with ostial stenosis related to calcified aortic wall plaque   Bifurcation:  Heavily calcified plaque without stenosis   Iliac arterial system:  Extensive plaque in common and internal iliac arteries.  External iliac arteries are unremarkable.  No stenosis or occlusion.   Venous phase shows patency of hepatic veins, portal vein, superior mesenteric vein, splenic vein, bilateral renal veins.   Small subpleural blebs in the visualized lung bases.  Unremarkable liver, gallbladder, spleen, adrenal glands.  There is marked bilateral renal parenchymal atrophy and small cysts.  No hydronephrosis.  Pancreas unremarkable.  Stomach and small bowel are nondilated.  Moderate colonic fecal material.  Urinary bladder is nondistended.  No ascites.  No free air.  Bilateral pelvic vascular calcifications.  Spondylitic changes in the lower lumbar spine.  Mild T12 compression deformity,   visible on prior lateral chest film of 02/05/2011.   IMPRESSION:   1.  Extensive atheromatous plaque in the abdominal aorta without significant proximal visceral arterial occlusive disease to suggest an etiology of mesenteric ischemia. 2.  Bilateral ostial renal artery stenoses of possible hemodynamic significance. 3.  T12 compression deformity.     Assessment & Plan:  78 yo female with PMH of end-stage renal disease on hemodialysis, GERD, hypertension, COPD, colon polyps who seen in followup after being seen in January 2013 for lower abdominal burning pain  1. Burning lower abdominal pain --  the patient's burning lower abdominal pain has completely resolved and the  only intervention made was increasing MiraLAX to twice daily. This seemed to improve her constipation, and perhaps constipation was the sole cause of her burning abdominal pain. She stopped the MiraLAX and her bowel habits remain normal for her, but I have advised that should constipation or the burning pain return, that I would restart MiraLAX 17 g twice daily. She voices understanding. We also discussed her atherosclerotic disease, and that the CT did not suggest hemodynamically significant mesenteric ischemia. She does have possible significant renal artery stenosis, but her blood pressure at least at present is well controlled. Will defer this to her primary care provider. She be seen on an as-needed basis going forward.

## 2011-05-28 ENCOUNTER — Ambulatory Visit (INDEPENDENT_AMBULATORY_CARE_PROVIDER_SITE_OTHER): Payer: Medicare Other | Admitting: Internal Medicine

## 2011-05-28 ENCOUNTER — Encounter: Payer: Self-pay | Admitting: Internal Medicine

## 2011-05-28 VITALS — BP 170/76 | HR 62 | Temp 97.4°F | Ht 63.0 in | Wt 107.0 lb

## 2011-05-28 DIAGNOSIS — J449 Chronic obstructive pulmonary disease, unspecified: Secondary | ICD-10-CM

## 2011-05-28 DIAGNOSIS — R0989 Other specified symptoms and signs involving the circulatory and respiratory systems: Secondary | ICD-10-CM

## 2011-05-28 DIAGNOSIS — R0609 Other forms of dyspnea: Secondary | ICD-10-CM

## 2011-05-28 DIAGNOSIS — J4489 Other specified chronic obstructive pulmonary disease: Secondary | ICD-10-CM

## 2011-05-28 DIAGNOSIS — R0600 Dyspnea, unspecified: Secondary | ICD-10-CM

## 2011-05-28 LAB — PULMONARY FUNCTION TEST

## 2011-05-28 NOTE — Assessment & Plan Note (Addendum)
-   Never smoker so this dx may not be tenable    - HFA 25% at baseline 04/11/11    - PFT's 05/28/2011 FEV1  0.81 (52%) ratio 54 and no change p B2,  DLC76%  She does indeed have a pattern of copd vs chronic asthma with irreversible component but not sure it's actually causing her symptoms  More likley this is  Classic Upper airway cough syndrome, so named because it's frequently impossible to sort out how much is  CR/sinusitis with freq throat clearing (which can be related to primary GERD)   vs  causing  secondary (" extra esophageal")  GERD from wide swings in gastric pressure that occur with throat clearing, often  promoting self use of mint and menthol lozenges that reduce the lower esophageal sphincter tone and exacerbate the problem further in a cyclical fashion.   These are the same pts (now being labeled as having "irritable larynx syndrome" by some cough centers) who not infrequently have a history of having failed to tolerate ace inhibitors,  dry powder inhalers or biphosphonates or report having atypical reflux symptoms that don't respond to standard doses of PPI , and are easily confused as having aecopd or asthma flares by even experienced allergists/ pulmonologists.  Try adding h1 at hs per guidelines then return to office in one month with all meds using a trust but verify approach here

## 2011-05-28 NOTE — Progress Notes (Signed)
  Subjective:    Patient ID: Brenda Leach, female    DOB: [birth year]    MRN: [id number]  Brief patient profile:  44 yobf never smoker esrf 2004 started HD  with episodes of sob and cough since around 2006 onset while living  in [place] moved to [place] 1st of November 2012 with daily symptoms since around winter 2012 referred 04/11/2011 by Dr Briant Cedar.    HPI 04/11/2011 1st pulmonary ov in [place] p pulmonary doctor in [place]  2012 "dx scar lung tissue" with daily symptoms of cough and sob (usually both linked) x one year worse first thing in am and also when lie down with sev tbsp each am of mucoid sputum. Better p use of saba but technique poor (see below). Denies ever being told she had sinus dz or bronchiectasis, just "scar tissue".  Denies dysphagia or overt HB. >CT sinus ok,  changed lopressor to bystolic and fosamax held. PPI /Pepcid rec   04/25/2011 Follow up and med review  Pt returns for follow up and med review. We reviewed all his meds and organized them into a med calendar. It appears she is taking  meds correctly.  Last ov he was changed from lopressor to bystolic . Prilosec and pepcid were added and Fosamax was held.  She is feeling better, more active. Decreased dyspnea.  rec Follow med calendar closely and bring to each visit.  Continue on current regimen.  follow up Dr. Sherene Sires  In 4 weeks with PFT and As needed      05/28/2011 f/u ov/Neala Miggins cc cough > sob, can do [facility] [employer] leaning on cart. Sensation of too much throat mucus esp in am and hs despite neg sinus ct and no excessive mucus production - not using saba daytime. Symptoms are worse the day after HD  Actually Sleeping ok without nocturnal  or early am exacerbation  of respiratory  c/o's or need for noct saba. Also denies any obvious fluctuation of symptoms with weather or environmental changes or other aggravating or alleviating factors except as outlined above.  ROS  At present neg for  any significant sore throat, dysphagia, itching,  sneezing,  nasal congestion or excess/ purulent secretions,  fever, chills, sweats, unintended wt loss, pleuritic or exertional cp, hempoptysis, orthopnea pnd or leg swelling.  Also denies presyncope, palpitations, heartburn, abdominal pain, nausea, vomiting, diarrhea  or change in bowel or urinary habits, dysuria,hematuria,  rash, arthralgias, visual complaints, headache, numbness weakness or ataxia.       Objective:   Physical Exam amb bf about stated age  [place] 104  04/11/11  >>106 04/25/2011 > 05/28/2011  107  HEENT: nl dentition, turbinates, and orophanx. Nl external ear canals without cough reflex   NECK :  without JVD/Nodes/TM/ nl carotid upstrokes bilaterally   LUNGS: no acc muscle use, clear to A and P bilaterally without cough on insp or exp maneuvers   CV:  RRR  no s3 or murmur or increase in P2, no edema  Left arm graft-Dialysis   ABD:  soft and nontender with nl excursion in the supine position. No bruits or organomegaly, bowel sounds nl  MS:  warm without deformities, calf tenderness, cyanosis or clubbing  SKIN: warm and dry without lesions    NEURO:  alert, approp, no deficits    cxr 03/07/11 Mild stable emphysematous changes but no acute pulmonary findings       Assessment & Plan:

## 2011-05-28 NOTE — Patient Instructions (Addendum)
Be sure to take the am pulmicort neb first thing in am  Add chlortrimeton 4 mg one at bedtime as per calendar  See calendar for specific medication instructions and bring it back for each and every office visit for every healthcare provider you see.  Without it,  you may not receive the best quality medical care that we feel you deserve.  You will note that the calendar groups together  your maintenance  medications that are timed at particular times of the day.  Think of this as your checklist for what your doctor has instructed you to do until your next evaluation to see what benefit  there is  to staying on a consistent group of medications intended to keep you well.  The other group at the bottom is entirely up to you to use as you see fit  for specific symptoms that may arise between visits that require you to treat them on an as needed basis.  Think of this as your action plan or "what if" list.   Separating the top medications from the bottom group is fundamental to providing you adequate care going forward.    GERD (REFLUX)  is an extremely common cause of respiratory symptoms, many times with no significant heartburn at all.    It can be treated with medication, but also with lifestyle changes including avoidance of late meals, excessive alcohol, smoking cessation, and avoid fatty foods, chocolate, peppermint, colas, red wine, and acidic juices such as orange juice.  NO MINT OR MENTHOL PRODUCTS SO NO COUGH DROPS  USE SUGARLESS CANDY INSTEAD (jolley ranchers or Stover's)  NO OIL BASED VITAMINS - use powdered substitutes.    Please schedule a follow up office visit in 4 weeks, sooner if needed with meds and pill box in hand

## 2011-05-28 NOTE — Progress Notes (Signed)
PFT done today. 

## 2011-05-30 ENCOUNTER — Encounter: Payer: Self-pay | Admitting: Internal Medicine

## 2011-06-30 ENCOUNTER — Encounter: Payer: Self-pay | Admitting: Internal Medicine

## 2011-06-30 ENCOUNTER — Ambulatory Visit (INDEPENDENT_AMBULATORY_CARE_PROVIDER_SITE_OTHER): Payer: Medicare Other | Admitting: Internal Medicine

## 2011-06-30 VITALS — BP 150/68 | HR 61 | Temp 98.1°F | Ht 65.0 in | Wt 108.8 lb

## 2011-06-30 DIAGNOSIS — J449 Chronic obstructive pulmonary disease, unspecified: Secondary | ICD-10-CM

## 2011-06-30 MED ORDER — FORMOTEROL FUMARATE 20 MCG/2ML IN NEBU: 20.0000 ug | INHALATION_SOLUTION | Freq: Two times a day (BID) | RESPIRATORY_TRACT | Status: DC

## 2011-06-30 NOTE — Assessment & Plan Note (Signed)
-   HFA 25%  06/30/2011 > add perforomist to pulmicort     - PFT's 05/28/2011 FEV1  0.81 (52%) ratio 54 and no change p B2,  DLC76%  Her cough is some better with rx as uacs but clearly has severe airflow obstruction probably secondary to longterm unaddressed airways dysfuntion with remodeling  DDX of  difficult airways managment all start with A and  include Adherence, Ace Inhibitors, Acid Reflux, Active Sinus Disease, Alpha 1 Antitripsin deficiency, Anxiety masquerading as Airways dz,  ABPA,  allergy(esp in young), Aspiration (esp in elderly), Adverse effects of DPI,  Active smokers, plus two Bs  = Bronchiectasis and Beta blocker use..and one C= CHF   In this case Adherence is the biggest issue and starts with  inability to use HFA effectively and also  understand that SABA treats the symptoms but doesn't get to the underlying problem (inflammation).  I used  the analogy of putting steroid cream on a rash to help explain the meaning of topical therapy and the need to get the drug to the target tissue.  She needs to remember to use the pulmicort consistently whether she thinks she needs it or not and will add performist at this point  Acid reflux still a concern as well > reviewed optimal timing of rx    Each maintenance medication was reviewed in detail including most importantly the difference between maintenance and as needed and under what circumstances the prns are to be used. This was done in the context of a medication calendar review which provided the patient with a user-friendly unambiguous mechanism for medication administration and reconciliation and provides an action plan for all active problems. It is critical that this be shown to every doctor  for modification during the office visit if necessary so the patient can use it as a working document.

## 2011-06-30 NOTE — Progress Notes (Signed)
Subjective:    Patient ID: Brenda Leach, female    DOB: Nov 09, 1923    MRN: [id number]  Brief patient profile:  83 yobf never smoker esrf 2004 started HD  with episodes of sob and cough since around 2006 onset while living  in [place] moved to [place] 1st of November 2012 with daily symptoms since around winter 2012 referred 04/11/2011 by Dr Briant Cedar.    HPI 04/11/2011 1st pulmonary ov in [place] p pulmonary doctor in [place]  2012 "dx scar lung tissue" with daily symptoms of cough and sob (usually both linked) x one year worse first thing in am and also when lie down with sev tbsp each am of mucoid sputum. Better p use of saba but technique poor (see below). Denies ever being told she had sinus dz or bronchiectasis, just "scar tissue".  Denies dysphagia or overt HB. >CT sinus ok,  changed lopressor to bystolic and fosamax held. PPI /Pepcid rec   04/25/2011 Follow up and med review  Pt returns for follow up and med review. We reviewed all his meds and organized them into a med calendar. It appears she is taking  meds correctly.  Last ov he was changed from lopressor to bystolic . Prilosec and pepcid were added and Fosamax was held.  She is feeling better, more active. Decreased dyspnea.  rec Follow med calendar closely and bring to each visit.  Continue on current regimen.  follow up Dr. Sherene Sires  In 4 weeks with PFT and As needed      05/28/2011 f/u ov/Andros Channing cc cough > sob, can do [facility] [employer] leaning on cart. Sensation of too much throat mucus esp in am and hs despite neg sinus ct and no excessive mucus production - not using saba daytime. Symptoms are worse the day after HD rec Be sure to take the am pulmicort neb first thing in am Add chlortrimeton 4 mg one at bedtime as per calendar See calendar for specific medication instructions  GERD diet/ f/u with pfts   06/30/2011 f/u ov/Denzil Mceachron cc pt states breathing has had little improvement doe x > slow adls.  avg proaire before bed and a couple or three times a  week, not taking meds correctly (at all) on days of HD. No purulent sputum.    Actually Sleeping ok without nocturnal  or early am exacerbation  of respiratory  c/o's or need for noct saba. Also denies any obvious fluctuation of symptoms with weather or environmental changes or other aggravating or alleviating factors except as outlined above.  ROS  At present neg for  any significant sore throat, dysphagia, itching, sneezing,  nasal congestion or excess/ purulent secretions,  fever, chills, sweats, unintended wt loss, pleuritic or exertional cp, hempoptysis, orthopnea pnd or leg swelling.  Also denies presyncope, palpitations, heartburn, abdominal pain, nausea, vomiting, diarrhea  or change in bowel or urinary habits, dysuria,hematuria,  rash, arthralgias, visual complaints, headache, numbness weakness or ataxia.       Objective:   Physical Exam amb bf about stated age  [place] 104  04/11/11  >>106 04/25/2011 > 05/28/2011  107> 06/30/2011  108  HEENT: nl dentition, turbinates, and orophanx. Nl external ear canals without cough reflex   NECK :  without JVD/Nodes/TM/ nl carotid upstrokes bilaterally   LUNGS: no acc muscle use,  Poor air movement bilaterally with distant insp / exp rhonchi   CV:  RRR  no s3 or murmur or increase in P2, no edema  Left arm graft-Dialysis  ABD:  soft and nontender with nl excursion in the supine position. No bruits or organomegaly, bowel sounds nl  MS:  warm without deformities, calf tenderness, cyanosis or clubbing  SKIN: warm and dry without lesions    NEURO:  alert, approp, no deficits    cxr 03/07/11 Mild stable emphysematous changes but no acute pulmonary findings       Assessment & Plan:

## 2011-06-30 NOTE — Patient Instructions (Signed)
Please see patient coordinator before you leave today  to get help filling your prescriptions for you nebulizer  Start Performist 20 mcg with pulmicort twice daily per nebulizer   Only use your albuterol as a rescue medication(proaire)  to be used if you can't catch your breath by resting or doing a relaxed purse lip breathing pattern. The less you use it, the better it will work when you need it.   See calendar for specific medication instructions and bring it back for each and every office visit for every healthcare provider you see.  Without it,  you may not receive the best quality medical care that we feel you deserve.  You will note that the calendar groups together  your maintenance  medications that are timed at particular times of the day.  Think of this as your checklist for what your doctor has instructed you to do until your next evaluation to see what benefit  there is  to staying on a consistent group of medications intended to keep you well.  The other group at the bottom is entirely up to you to use as you see fit  for specific symptoms that may arise between visits that require you to treat them on an as needed basis.  Think of this as your action plan or "what if" list.   Separating the top medications from the bottom group is fundamental to providing you adequate care going forward.

## 2011-07-01 ENCOUNTER — Telehealth: Payer: Self-pay | Admitting: Internal Medicine

## 2011-07-01 DIAGNOSIS — J449 Chronic obstructive pulmonary disease, unspecified: Secondary | ICD-10-CM

## 2011-07-01 MED ORDER — BUDESONIDE 0.5 MG/2ML IN SUSP: 0.5000 mg | Freq: Two times a day (BID) | RESPIRATORY_TRACT | Status: DC

## 2011-07-01 MED ORDER — FORMOTEROL FUMARATE 20 MCG/2ML IN NEBU: 20.0000 ug | INHALATION_SOLUTION | Freq: Two times a day (BID) | RESPIRATORY_TRACT | Status: DC

## 2011-07-01 MED ORDER — BUDESONIDE 0.5 MG/2ML IN SUSP: 0.5000 mg | Freq: Two times a day (BID) | RESPIRATORY_TRACT | Status: AC

## 2011-07-01 NOTE — Telephone Encounter (Signed)
I called [facility] and spoke with [facility] and she stated the rx for budesonide and performist has to go directly through them. They stated on the rx for these the quantity was only for 2 ML. I gave verbal order for 120 ml's for both rx's. They needed nothing further. I called and made stacey aware of this and she voiced her understanding and needed nothing further from [place]

## 2011-07-01 NOTE — Progress Notes (Signed)
Addended by: Sandrea Hughs B on: 07/01/2011 10:05 AM   Modules accepted: Orders

## 2011-07-01 NOTE — Telephone Encounter (Signed)
Canary Brim from [facility] needs nurse to call her for the rx's as this needs to go directly thru her (can't take order from [facility]) [phone]. Hazel Sams

## 2011-07-01 NOTE — Telephone Encounter (Signed)
Stacey, APS, requests to get this completed before she leaves today at 5:00 pm.  Please call.  Antionette Fairy

## 2011-08-13 ENCOUNTER — Ambulatory Visit (INDEPENDENT_AMBULATORY_CARE_PROVIDER_SITE_OTHER): Payer: Medicare Other | Admitting: Internal Medicine

## 2011-08-13 ENCOUNTER — Encounter: Payer: Self-pay | Admitting: Internal Medicine

## 2011-08-13 VITALS — BP 128/60 | HR 78 | Temp 98.3°F | Ht 64.0 in | Wt 108.8 lb

## 2011-08-13 DIAGNOSIS — J4550 Severe persistent asthma, uncomplicated: Secondary | ICD-10-CM

## 2011-08-13 DIAGNOSIS — J45909 Unspecified asthma, uncomplicated: Secondary | ICD-10-CM

## 2011-08-13 NOTE — Progress Notes (Signed)
Subjective:    Patient ID: Brenda Leach, female    DOB: [birth year]    MRN: [id number]  Brief patient profile:  43 yobf never smoker esrf 2004 started HD  with episodes of sob and cough since around 2006 onset while living  in [place] moved to [place] 1st of November 2012 with daily symptoms since around winter 2012 referred 04/11/2011 by Dr Briant Cedar.    HPI 04/11/2011 1st pulmonary ov in [place] p pulmonary doctor in [place]  2012 "dx scar lung tissue" with daily symptoms of cough and sob (usually both linked) x one year worse first thing in am and also when lie down with sev tbsp each am of mucoid sputum. Better p use of saba but technique poor (see below). Denies ever being told she had sinus dz or bronchiectasis, just "scar tissue".  Denies dysphagia or overt HB. >CT sinus ok,  changed lopressor to bystolic and fosamax held. PPI /Pepcid rec   04/25/2011 Follow up and med review  Pt returns for follow up and med review. We reviewed all his meds and organized them into a med calendar. It appears she is taking  meds correctly.  Last ov he was changed from lopressor to bystolic . Prilosec and pepcid were added and Fosamax was held.  She is feeling better, more active. Decreased dyspnea.  rec Follow med calendar closely and bring to each visit.  Continue on current regimen.  follow up Dr. Sherene Sires  In 4 weeks with PFT and As needed      05/28/2011 f/u ov/Jamani Eley cc cough > sob, can do [facility] teeter leaning on cart. Sensation of too much throat mucus esp in am and hs despite neg sinus ct and no excessive mucus production - not using saba daytime. Symptoms are worse the day after HD rec Be sure to take the am pulmicort neb first thing in am Add chlortrimeton 4 mg one at bedtime as per calendar See calendar for specific medication instructions  GERD diet/ f/u with pfts   06/30/2011 f/u ov/Bernell Sigal cc pt states breathing has had little improvement doe x > slow adls.  avg proaire before bed and a couple or three times a  week, not taking meds correctly (at all) on days of HD. No purulent sputum. rec Please see patient coordinator before you leave today  to get help filling your prescriptions for you nebulizer Start Performist 20 mcg with pulmicort twice daily per nebulizer  Only use your albuterol as a rescue medication(proaire) See calendar for specific medication instructions   08/13/2011 f/u ov/Azan Maneri did not start perfomist p read the package insertdoe x grocery store leaning on cart now using pulmicort bid and proair sev times a week mostly datyime no assoc cough.  Actually Sleeping ok without nocturnal  or early am exacerbation  of respiratory  c/o's or need for noct saba. Also denies any obvious fluctuation of symptoms with weather or environmental changes or other aggravating or alleviating factors except as outlined above.  ROS  At present neg for  any significant sore throat, dysphagia, itching, sneezing,  nasal congestion or excess/ purulent secretions,  fever, chills, sweats, unintended wt loss, pleuritic or exertional cp, hempoptysis, orthopnea pnd or leg swelling.  Also denies presyncope, palpitations, heartburn, abdominal pain, nausea, vomiting, diarrhea  or change in bowel or urinary habits, dysuria,hematuria,  rash, arthralgias, visual complaints, headache, numbness weakness or ataxia.       Objective:   Physical Exam  amb bf about stated age nad  [place] 104  04/11/11  >>106 04/25/2011 > 05/28/2011  107> 06/30/2011  108 > 08/13/2011  108   HEENT: nl dentition, turbinates, and orophanx. Nl external ear canals without cough reflex   NECK :  without JVD/Nodes/TM/ nl carotid upstrokes bilaterally   LUNGS: no acc muscle use,  Poor air movement bilaterally with distant insp / exp rhonchi   CV:  RRR  no s3 or murmur or increase in P2, no edema  Left arm graft-Dialysis   ABD:  soft and nontender with nl excursion in the supine position. No bruits or organomegaly, bowel sounds nl  MS:  warm without  deformities, calf tenderness, cyanosis or clubbing       cxr 03/07/11 Mild stable emphysematous changes but no acute pulmonary findings       Assessment & Plan:

## 2011-08-13 NOTE — Assessment & Plan Note (Addendum)
-   HFA 25%  06/30/2011 > add perforomist to pulmicort recommended but not done    - PFT's 05/28/2011 FEV1  0.81 (52%) ratio 54 and no change p B2,  DLC76%  Symptomatically improved with less need for saba which is good because her hfa is so poor she's not getting much anyway.  I had an extended discussion with the patient today lasting 15 to 20 minutes of a 25 minute visit on the following issues:   Explained dangers of overuse or reliance on saba and indications for maint rx with performist including all the usual warnings to use this only in combination with pulmicort  No need for pulmonary f/u at this point, just prn increased symptoms or perceived need for saba

## 2011-08-13 NOTE — Patient Instructions (Signed)
If your breathing worsens or you need to use your rescue inhaler(proaire= albuterol) more than twice weekly or wake up more than twice a month with any respiratory symptoms  I recommend you add performist to the nebulizer and take it twice daily with pulmicort (budesonide)  Rescue inhalers do not control inflammation and overuse can lead to unnecessary and costly consequences.  They can make you feel better temporarily but eventually they will quit working effectively much as sleep aids lead to more insomnia if used regularly.

## 2011-08-27 ENCOUNTER — Ambulatory Visit
Admission: RE | Admit: 2011-08-27 | Discharge: 2011-08-27 | Disposition: A | Discharge status: Home / Self Care | Payer: Medicare Other | Source: Ambulatory Visit | Attending: Nephrology | Admitting: Nephrology

## 2011-08-27 ENCOUNTER — Other Ambulatory Visit: Payer: Self-pay | Admitting: Nephrology

## 2011-08-27 DIAGNOSIS — R0602 Shortness of breath: Secondary | ICD-10-CM

## 2012-02-03 ENCOUNTER — Ambulatory Visit
Admission: RE | Admit: 2012-02-03 | Discharge: 2012-02-03 | Disposition: A | Discharge status: Home / Self Care | Payer: Medicare Other | Source: Ambulatory Visit | Attending: Nephrology | Admitting: Nephrology

## 2012-02-03 ENCOUNTER — Other Ambulatory Visit: Payer: Self-pay | Admitting: Nephrology

## 2012-02-03 DIAGNOSIS — R0602 Shortness of breath: Secondary | ICD-10-CM

## 2012-02-09 ENCOUNTER — Other Ambulatory Visit: Payer: Self-pay | Admitting: Nephrology

## 2012-02-09 DIAGNOSIS — R911 Solitary pulmonary nodule: Secondary | ICD-10-CM

## 2012-02-10 ENCOUNTER — Ambulatory Visit
Admission: RE | Admit: 2012-02-10 | Discharge: 2012-02-10 | Disposition: A | Discharge status: Home / Self Care | Payer: Medicare Other | Source: Ambulatory Visit | Attending: Nephrology | Admitting: Nephrology

## 2012-02-10 DIAGNOSIS — R911 Solitary pulmonary nodule: Secondary | ICD-10-CM

## 2012-02-10 MED ORDER — IOHEXOL 300 MG/ML  SOLN
75.0000 mL | Freq: Once | INTRAMUSCULAR | Status: AC | PRN
  Administered 2012-02-10: 75 mL via INTRAVENOUS

## 2012-02-26 ENCOUNTER — Encounter: Payer: Self-pay | Admitting: Internal Medicine

## 2012-02-26 ENCOUNTER — Ambulatory Visit (INDEPENDENT_AMBULATORY_CARE_PROVIDER_SITE_OTHER): Payer: Medicare Other | Admitting: Internal Medicine

## 2012-02-26 VITALS — BP 130/72 | HR 94 | Temp 97.2°F | Ht 65.0 in | Wt 104.0 lb

## 2012-02-26 DIAGNOSIS — R918 Other nonspecific abnormal finding of lung field: Secondary | ICD-10-CM

## 2012-02-26 DIAGNOSIS — J45909 Unspecified asthma, uncomplicated: Secondary | ICD-10-CM

## 2012-02-26 DIAGNOSIS — J4550 Severe persistent asthma, uncomplicated: Secondary | ICD-10-CM

## 2012-02-26 DIAGNOSIS — K219 Gastro-esophageal reflux disease without esophagitis: Secondary | ICD-10-CM

## 2012-02-26 NOTE — Assessment & Plan Note (Signed)
-   HFA 25%  06/30/2011 > add perforomist to pulmicort recommended but not done    - PFT's 05/28/2011 FEV1  0.81 (52%) ratio 54 and no change p B2,  DLC76%       She is way overusing saba and not good at it technique wise so needs to go ahead and try perforomist as prev rec and should see much less need for saba

## 2012-02-26 NOTE — Progress Notes (Signed)
Subjective:    Patient ID: Brenda Leach, female    DOB: 31-Dec-1923    MRN: [id number]  Brief patient profile:  38 yobf never smoker esrf 2004 started HD  with episodes of sob and cough since around 2006 onset while living  in [place] moved to [place] 1st of November 2012 with daily symptoms since around winter 2012 referred 04/11/2011 by Dr Briant Cedar.    HPI 04/11/2011 1st pulmonary ov in [place] p pulmonary doctor in [place]  2012 "dx scar lung tissue" with daily symptoms of cough and sob (usually both linked) x one year worse first thing in am and also when lie down with sev tbsp each am of mucoid sputum. Better p use of saba but technique poor (see below). Denies ever being told she had sinus dz or bronchiectasis, just "scar tissue".  Denies dysphagia or overt HB. >CT sinus ok,  changed lopressor to bystolic and fosamax held. PPI /Pepcid rec   04/25/2011 Follow up and med review  Pt returns for follow up and med review. We reviewed all his meds and organized them into a med calendar. It appears she is taking  meds correctly.  Last ov he was changed from lopressor to bystolic . Prilosec and pepcid were added and Fosamax was held.  She is feeling better, more active. Decreased dyspnea.  rec Follow med calendar closely and bring to each visit.  Continue on current regimen.  follow up Dr. Sherene Sires  In 4 weeks with PFT and As needed      05/28/2011 f/u ov/Marigene Erler cc cough > sob, can do [facility] teeter leaning on cart. Sensation of too much throat mucus esp in am and hs despite neg sinus ct and no excessive mucus production - not using saba daytime. Symptoms are worse the day after HD rec Be sure to take the am pulmicort neb first thing in am Add chlortrimeton 4 mg one at bedtime as per calendar See calendar for specific medication instructions  GERD diet/ f/u with pfts   06/30/2011 f/u ov/Kacy Hegna cc pt states breathing has had little improvement doe x > slow adls.  avg proaire before bed and a couple or three times a  week, not taking meds correctly (at all) on days of HD. No purulent sputum. rec Please see patient coordinator before you leave today  to get help filling your prescriptions for you nebulizer Start Performist 20 mcg with pulmicort twice daily per nebulizer  Only use your albuterol as a rescue medication(proaire) See calendar for specific medication instructions   08/13/2011 f/u ov/Ashan Cueva did not start perfomist p read the package insert doe x grocery store leaning on cart now using pulmicort bid and proair sev times a week mostly datyime no assoc cough. rec If your breathing worsens or you need to use your rescue inhaler(proaire= albuterol) more than twice weekly or wake up more than twice a month with any respiratory symptoms  I recommend you add performist to the nebulizer and take it twice daily with pulmicort (budesonide)   02/26/2012 f/u ov/Vernelle Wisner cc never tried performist and breathing worse then cough started one month prior to OV  With production of slt brown mucus worse in ams and requiring 02 but only during HD and using a lot more daytime saba.  Referred back by Dr Briant Cedar for abn cxr (note previous cxr 08/27/11 just showed copd).  No fever, chills, no h/o previous dx of tumors.  Actually Sleeping ok without nocturnal  or early am exacerbation  of  respiratory  c/o's or need for noct saba. Also denies any obvious fluctuation of symptoms with weather or environmental changes or other aggravating or alleviating factors except as outlined above.  ROS  At present neg for  any significant sore throat, dysphagia, itching, sneezing,  nasal congestion or excess/ purulent secretions,  fever, chills, sweats, unintended wt loss, pleuritic or exertional cp, hempoptysis, orthopnea pnd or leg swelling.  Also denies presyncope, palpitations, heartburn, abdominal pain, nausea, vomiting, diarrhea  or change in bowel or urinary habits, dysuria,hematuria,  rash, arthralgias, visual complaints, headache, numbness  weakness or ataxia.       Objective:   Physical Exam  amb bf about stated age nad  [place] 104  04/11/11 >106 04/25/2011 > 05/28/2011  107> 06/30/2011  108 > 08/13/2011  108 > 104 02/26/2012   HEENT: nl dentition, turbinates, and orophanx. Nl external ear canals without cough reflex   NECK :  without JVD/Nodes/TM/ nl carotid upstrokes bilaterally   LUNGS: no acc muscle use,  Poor air movement bilaterally with distant insp / exp rhonchi   CV:  RRR  no s3 or murmur or increase in P2, no edema  Left arm graft-Dialysis   ABD:  soft and nontender with nl excursion in the supine position. No bruits or organomegaly, bowel sounds nl  MS:  warm without deformities, calf tenderness, cyanosis or clubbing        cxr 02/03/12 Multiple lung nodules right greater than left worrisome for  metastatic involvement of the lungs.        Assessment & Plan:

## 2012-02-26 NOTE — Patient Instructions (Signed)
Please see patient coordinator before you leave today  to schedule for PET scan and I will call you after review to decide the next step  Add perforomist twice daily with budesonide - this should help your breathing and reduce your need for the proaire  Prilosec(omeprazole)  20 mg Take 30-60 min before first meal of the day and Pepcid 20 mg one at bedtime for now automatically

## 2012-02-26 NOTE — Assessment & Plan Note (Signed)
Not compliant with recs for gerd suppression > now coughing with ? Secondary gerd from  Coughing > reviewed max rx

## 2012-02-27 NOTE — Assessment & Plan Note (Signed)
-   first detected on cxr 02/03/12 p neg cxr 08/2011 > see CT  02/10/12   - PET ordered  02/26/2012 >>>  Key is to obtain a tissue dx through the least invasive means but even then doubt she'll be a candidate for anything but palliative rx at age 78 with multiple comorbidities

## 2012-03-04 ENCOUNTER — Encounter (HOSPITAL_COMMUNITY)
Admission: RE | Admit: 2012-03-04 | Discharge: 2012-03-04 | Disposition: A | Discharge status: Home / Self Care | Payer: Medicare Other | Source: Ambulatory Visit | Attending: Internal Medicine | Admitting: Internal Medicine

## 2012-03-04 DIAGNOSIS — C787 Secondary malignant neoplasm of liver and intrahepatic bile duct: Secondary | ICD-10-CM | POA: Insufficient documentation

## 2012-03-04 DIAGNOSIS — C801 Malignant (primary) neoplasm, unspecified: Secondary | ICD-10-CM | POA: Insufficient documentation

## 2012-03-04 DIAGNOSIS — R918 Other nonspecific abnormal finding of lung field: Secondary | ICD-10-CM

## 2012-03-04 MED ORDER — FLUDEOXYGLUCOSE F - 18 (FDG) INJECTION
17.9000 | Freq: Once | INTRAVENOUS | Status: AC | PRN
  Administered 2012-03-04: 17.9 via INTRAVENOUS

## 2012-03-05 ENCOUNTER — Other Ambulatory Visit (HOSPITAL_COMMUNITY): Payer: Self-pay | Admitting: Internal Medicine

## 2012-03-05 DIAGNOSIS — R1310 Dysphagia, unspecified: Secondary | ICD-10-CM

## 2012-03-08 ENCOUNTER — Telehealth: Payer: Self-pay | Admitting: Internal Medicine

## 2012-03-08 ENCOUNTER — Other Ambulatory Visit: Payer: Self-pay | Admitting: Internal Medicine

## 2012-03-08 DIAGNOSIS — R1900 Intra-abdominal and pelvic swelling, mass and lump, unspecified site: Secondary | ICD-10-CM

## 2012-03-08 NOTE — Telephone Encounter (Signed)
Please advise [facility];s thanks 

## 2012-03-08 NOTE — Telephone Encounter (Signed)
Sorry, this is my fault. Order placed today

## 2012-03-08 NOTE — Telephone Encounter (Signed)
Result Notes     Notes Recorded by Nyoka Cowden, MD on 03/05/2012 at 5:20 PM Call patient : Study is c/w abd primary, discussed with daughter > set up for ct bx asap   -----  i do not see anything ordered. I spoke with daughter and she has not heard from anyone either. Please advise Dr. Sherene Sires thanks

## 2012-03-08 NOTE — Telephone Encounter (Signed)
radioly will put this in review and pt will be contacted by them Tobe Sos

## 2012-03-11 ENCOUNTER — Other Ambulatory Visit: Payer: Self-pay | Admitting: Radiology

## 2012-03-11 ENCOUNTER — Telehealth: Payer: Self-pay | Admitting: Internal Medicine

## 2012-03-11 DIAGNOSIS — R918 Other nonspecific abnormal finding of lung field: Secondary | ICD-10-CM

## 2012-03-11 NOTE — Telephone Encounter (Signed)
Called spoke with Tiffany at [facility] scheduling who reported that per Dr Levada Dy, the biopsy order needs to be US Biopsy rather than the CT biopsy that was ordered.    This has been done with Tiffany on the phone.  Nothing further needed; will sign off.

## 2012-03-12 ENCOUNTER — Encounter (HOSPITAL_COMMUNITY): Payer: Self-pay | Admitting: Pharmacy Technician

## 2012-03-15 ENCOUNTER — Ambulatory Visit (HOSPITAL_COMMUNITY)
Admission: RE | Admit: 2012-03-15 | Discharge: 2012-03-15 | Disposition: A | Discharge status: Home / Self Care | Payer: Medicare Other | Source: Ambulatory Visit | Attending: Internal Medicine | Admitting: Internal Medicine

## 2012-03-15 DIAGNOSIS — R222 Localized swelling, mass and lump, trunk: Secondary | ICD-10-CM | POA: Insufficient documentation

## 2012-03-15 DIAGNOSIS — R918 Other nonspecific abnormal finding of lung field: Secondary | ICD-10-CM

## 2012-03-15 LAB — PROTIME-INR
INR: 1.06 (ref 0.00–1.49)
Prothrombin Time: 13.7 seconds (ref 11.6–15.2)

## 2012-03-15 LAB — CBC
HCT: 33.5 % — ABNORMAL LOW (ref 36.0–46.0)
Hemoglobin: 10.6 g/dL — ABNORMAL LOW (ref 12.0–15.0)
MCH: 27.1 pg (ref 26.0–34.0)
RBC: 3.91 MIL/uL (ref 3.87–5.11)

## 2012-03-15 MED ORDER — SODIUM CHLORIDE 0.9 % IV SOLN: Freq: Once | INTRAVENOUS | Status: DC

## 2012-03-15 MED ORDER — MIDAZOLAM HCL 2 MG/2ML IJ SOLN
INTRAMUSCULAR | Status: AC | PRN
  Administered 2012-03-15: 1 mg via INTRAVENOUS

## 2012-03-15 MED ORDER — FENTANYL CITRATE 0.05 MG/ML IJ SOLN
INTRAMUSCULAR | Status: AC | PRN
  Administered 2012-03-15 (×2): 25 ug via INTRAVENOUS

## 2012-03-15 MED ORDER — MIDAZOLAM HCL 2 MG/2ML IJ SOLN
INTRAMUSCULAR | Status: AC
  Filled 2012-03-15: qty 4

## 2012-03-15 MED ORDER — FENTANYL CITRATE 0.05 MG/ML IJ SOLN
INTRAMUSCULAR | Status: AC
  Filled 2012-03-15: qty 4

## 2012-03-15 MED ORDER — HYDROCODONE-ACETAMINOPHEN 5-325 MG PO TABS: 1.0000 | ORAL_TABLET | ORAL | Status: DC | PRN

## 2012-03-15 NOTE — Procedures (Signed)
US core liver lesion 18g x2 to surg path No complication No blood loss. See complete dictation in [facility] PACS.  

## 2012-03-15 NOTE — ED Notes (Signed)
Requested SS bed from [place], [place]

## 2012-03-15 NOTE — H&P (Signed)
Brenda Leach is an 78 y.o. female.   Chief Complaint: "I'm here for a biopsy" HPI: Patient with recent imaging studies revealing pulmonary nodules, retroperitoneal mass, liver and bony lesions presents today for US guided left lobe liver lesion.  Past Medical History  Diagnosis Date  . Hypertension   . Asthma   . IBS (irritable bowel syndrome)   . Shortness of breath   . Blood transfusion   . GERD (gastroesophageal reflux disease)   . Arthritis   . Vitamin D deficiency   . Osteoporosis   . Abdominal pain, epigastric   . Extrinsic asthma, unspecified   . Chronic airway obstruction, not elsewhere classified   . Chronic kidney disease, stage IV (severe)   . Insomnia, unspecified   . Other malaise and fatigue   . Anemia     hx of   . Anxiety   . Hx of colonic polyps     Past Surgical History  Procedure Date  . Abdominal hysterectomy   . Neck surgery   . Tumor removal   . Throat surgery     Family History  Problem Relation Age of Onset  . Liver cancer Father   . Stroke Mother   . Colon cancer Neg Hx   . Diabetes Brother   . Heart disease Sister   . Liver disease Father    Social History:  reports that she has never smoked. She has never used smokeless tobacco. She reports that she does not drink alcohol or use illicit drugs.  Allergies:  Allergies  Allergen Reactions  . Excedrin (Aspirin-Acetaminophen-Caffeine) Other (See Comments)    Name brand Excedrin causes dizziness.  No reaction to tylenol    Current outpatient prescriptions:acetaminophen (TYLENOL) 325 MG tablet, Take 325-650 mg by mouth every 6 (six) hours as needed. For pain, Disp: , Rfl: ;  albuterol (PROAIR HFA) 108 (90 BASE) MCG/ACT inhaler, Inhale 2 puffs into the lungs every 4 (four) hours as needed for wheezing or shortness of breath., Disp: , Rfl: ;  aspirin EC 81 MG tablet, Take 81 mg by mouth daily., Disp: , Rfl:  B Complex-C-Folic Acid (RENAL SOFTGELS) 1 MG CAPS, Take 1 capsule (1 mg total) by  mouth daily., Disp: , Rfl: ;  budesonide (PULMICORT) 0.5 MG/2ML nebulizer solution, Take 2 mLs (0.5 mg total) by nebulization 2 (two) times daily., Disp: 120 mL, Rfl: 11;  chlorpheniramine (CHLOR-TRIMETON) 2 MG/5ML syrup, Take 4 mg by mouth at bedtime., Disp: , Rfl:  dextromethorphan-guaiFENesin (MUCINEX DM) 30-600 MG per 12 hr tablet, Take 1 tablet by mouth every 12 (twelve) hours as needed. For congestion, Disp: , Rfl: ;  docusate sodium (COLACE) 100 MG capsule, Take 100 mg by mouth daily as needed. For constipation, Disp: , Rfl: ;  famotidine (PEPCID) 20 MG tablet, Take 20 mg by mouth at bedtime., Disp: , Rfl:  fluocinolone (FLUOCINOLONE ACETONIDE SCALP) 0.01 % external oil, Apply 1 application topically daily. Not on dialysis days, Disp: , Rfl: ;  fluticasone (FLONASE) 50 MCG/ACT nasal spray, Place 2 sprays into the nose daily as needed. For stuffiness, Disp: , Rfl: ;  formoterol (PERFOROMIST) 20 MCG/2ML nebulizer solution, Take 20 mcg by nebulization 2 (two) times daily., Disp: , Rfl:  guaiFENesin-dextromethorphan (ROBITUSSIN DM) 100-10 MG/5ML syrup, Take 5 mLs by mouth 3 (three) times daily as needed. For congestion, Disp: , Rfl: ;  hydrALAZINE (APRESOLINE) 25 MG tablet, Take 25 mg by mouth 3 (three) times daily. , Disp: , Rfl: ;  lanthanum (FOSRENOL) 500  MG chewable tablet, Chew 1,000 mg by mouth daily. (chew 2 daily except on days pt has dialysis per kidney Dr), Disp: , Rfl:  montelukast (SINGULAIR) 10 MG tablet, Take 10 mg by mouth at bedtime. , Disp: , Rfl: ;  Nebivolol HCl (BYSTOLIC) 20 MG TABS, Take 20 mg by mouth daily. , Disp: , Rfl: ;  NIFEdipine (PROCARDIA XL/ADALAT-CC) 60 MG 24 hr tablet, Take 60 mg by mouth 2 (two) times daily., Disp: , Rfl: ;  omeprazole (PRILOSEC) 20 MG capsule, Take 20 mg by mouth daily. 30-60 minutes before first meal of the day, Disp: , Rfl:  polyethylene glycol (MIRALAX / GLYCOLAX) packet, Take 17 g by mouth daily as needed. For constipation,  Dissolve in 8 oz of liquid  and drink, Disp: , Rfl: ;  telmisartan (MICARDIS) 80 MG tablet, Take 1 tablet (80 mg total) by mouth at bedtime., Disp: , Rfl: ;  [DISCONTINUED] diphenhydrAMINE (BENADRYL) 25 mg capsule, Per bottle, Disp: , Rfl:  Current facility-administered medications:0.9 %  sodium chloride infusion, , Intravenous, Once, Brenda Leu, PA  03/15/2012 labs pending Review of Systems  Constitutional: Positive for weight loss. Negative for fever and chills.  HENT: Positive for congestion.   Respiratory: Positive for cough and shortness of breath.   Cardiovascular: Negative for chest pain.  Gastrointestinal: Positive for nausea and abdominal pain. Negative for vomiting.  Musculoskeletal: Positive for back pain.  Neurological: Negative for headaches.    Blood pressure 194/88, pulse 72, temperature 97.5 F (36.4 C), temperature source Oral, resp. rate 16, height 5\' 4"  (1.626 m), weight 104 lb (47.174 kg), SpO2 97.00%. Physical Exam  Constitutional: She is oriented to person, place, and time.       Thin BF c/o intermittent "burning " abd pain  Cardiovascular: Normal rate and regular rhythm.        Left upper arm dialysis graft with good thrill/bruit  Respiratory: Effort normal.       Dim BS bases  GI: Soft. Bowel sounds are normal. There is tenderness.       Palpable mid abd mass  Musculoskeletal: Normal range of motion.  Neurological: She is alert and oriented to person, place, and time.     Assessment/Plan: Pt with multiple hypermetabolic pulmonary, bony and liver lesions as well as RP/abd mass. Plan is for US guided biopsy of a left lobe liver lesion today. Details/risks of above d/w pt/pt's daughter with their understanding and consent. Brenda Leach,D KEVIN 03/15/2012, 12:53 PM

## 2012-03-15 NOTE — Discharge Instructions (Signed)
Needle Biopsy Care After These instructions give you information on caring for yourself after your procedure. Your doctor may also give you more specific instructions. Call your doctor if you have any problems or questions after your procedure. HOME CARE  Rest for 4 hours after your biopsy, except for getting up to go to the bathroom or as told.  Keep the places where the needles were put in clean and dry.  Do not put powder or lotion on the sites.  Do not shower until 24 hours after the test. Remove all bandages (dressings) before showering.  Remove all bandages at least once every day. Gently clean the sites with soap and water. Keep putting a new bandage on until the skin is closed. Finding out the results of your test Ask your doctor when your test results will be ready. Make sure you follow up and get the test results. GET HELP RIGHT AWAY IF:   You have shortness of breath or trouble breathing.  You have pain or cramping in your belly (abdomen).  You feel sick to your stomach (nauseous) or throw up (vomit).  Any of the places where the needles were put in:  Are puffy (swollen) or red.  Are sore or hot to the touch.  Are draining yellowish-white fluid (pus).  Are bleeding after 10 minutes of pressing down on the site. Have someone keep pressing on any place that is bleeding until you see a doctor.  You have any unusual pain that will not stop.  You have a fever. If you go to the emergency room, tell the nurse that you had a biopsy. Take this paper with you to show the nurse. MAKE SURE YOU:   Understand these instructions.  Will watch your condition.  Will get help right away if you are not doing well or get worse. Document Released: 02/14/2008 Document Revised: 05/26/2011 Document Reviewed: 02/14/2008 [facility] Patient Information 2013 [place], [place].

## 2012-03-16 ENCOUNTER — Other Ambulatory Visit (HOSPITAL_COMMUNITY): Payer: Medicare Other

## 2012-03-19 ENCOUNTER — Encounter: Payer: Self-pay | Admitting: Internal Medicine

## 2012-03-19 ENCOUNTER — Telehealth: Payer: Self-pay | Admitting: Internal Medicine

## 2012-03-19 DIAGNOSIS — C269 Malignant neoplasm of ill-defined sites within the digestive system: Secondary | ICD-10-CM | POA: Insufficient documentation

## 2012-03-19 NOTE — Telephone Encounter (Signed)
Spoke with Brenda Leach patients daughter.  Brenda Leach requesting results of patients biopsy done 03/15/12.  Brenda Leach is aware Dr. Sherene Sires has been out office, informed her that I would send msg to him and we would return call as soon as results are available.  Dr. Sherene Sires please advise, thank you!

## 2012-03-19 NOTE — Telephone Encounter (Signed)
Let them know that the final result is not back - it does show a tumor but not sure what type/ where it came from which makes a big difference in rx but will contact them first of next week with update when available.

## 2012-03-19 NOTE — Telephone Encounter (Signed)
[email or web address].Tien Spooner, CMA  

## 2012-03-22 ENCOUNTER — Other Ambulatory Visit: Payer: Self-pay | Admitting: Internal Medicine

## 2012-03-22 ENCOUNTER — Encounter: Payer: Self-pay | Admitting: Internal Medicine

## 2012-03-22 ENCOUNTER — Telehealth: Payer: Self-pay | Admitting: *Deleted

## 2012-03-22 ENCOUNTER — Other Ambulatory Visit (HOSPITAL_COMMUNITY): Payer: Medicare Other

## 2012-03-22 DIAGNOSIS — R1900 Intra-abdominal and pelvic swelling, mass and lump, unspecified site: Secondary | ICD-10-CM

## 2012-03-22 NOTE — Telephone Encounter (Signed)
Left message for pt to call me with phone number

## 2012-03-22 NOTE — Telephone Encounter (Signed)
Spoke with patient's daughter, Dub Mikes, by phone and confirmed appointment for 03/30/12 Dr. Darrold Span.  Contact name and phone number for self and [facility] given.  She confirmed.

## 2012-03-22 NOTE — Telephone Encounter (Signed)
Pt daughter states the Dr. Sherene Sires called them this AM about results. Carron Curie, CMA

## 2012-03-22 NOTE — Telephone Encounter (Signed)
LMTCB x 1 

## 2012-03-23 ENCOUNTER — Telehealth: Payer: Self-pay | Admitting: Oncology

## 2012-03-23 ENCOUNTER — Ambulatory Visit (HOSPITAL_COMMUNITY)
Admission: RE | Admit: 2012-03-23 | Discharge: 2012-03-23 | Disposition: A | Discharge status: Home / Self Care | Payer: Medicare Other | Source: Ambulatory Visit | Attending: Internal Medicine | Admitting: Internal Medicine

## 2012-03-23 DIAGNOSIS — R1310 Dysphagia, unspecified: Secondary | ICD-10-CM

## 2012-03-23 NOTE — Telephone Encounter (Signed)
Welcome packet mailed w/calendar.

## 2012-03-29 ENCOUNTER — Other Ambulatory Visit: Payer: Self-pay | Admitting: Oncology

## 2012-03-29 DIAGNOSIS — C269 Malignant neoplasm of ill-defined sites within the digestive system: Secondary | ICD-10-CM

## 2012-03-29 DIAGNOSIS — C259 Malignant neoplasm of pancreas, unspecified: Secondary | ICD-10-CM

## 2012-03-30 ENCOUNTER — Ambulatory Visit (HOSPITAL_BASED_OUTPATIENT_CLINIC_OR_DEPARTMENT_OTHER): Payer: Medicare Other | Admitting: Oncology

## 2012-03-30 ENCOUNTER — Encounter: Payer: Self-pay | Admitting: Oncology

## 2012-03-30 ENCOUNTER — Telehealth: Payer: Self-pay | Admitting: Oncology

## 2012-03-30 ENCOUNTER — Other Ambulatory Visit (HOSPITAL_BASED_OUTPATIENT_CLINIC_OR_DEPARTMENT_OTHER): Payer: Medicare Other | Admitting: Lab

## 2012-03-30 ENCOUNTER — Ambulatory Visit: Payer: Medicare Other

## 2012-03-30 VITALS — BP 178/82 | HR 81 | Temp 96.7°F | Resp 20 | Ht 64.0 in | Wt 104.4 lb

## 2012-03-30 DIAGNOSIS — C7800 Secondary malignant neoplasm of unspecified lung: Secondary | ICD-10-CM

## 2012-03-30 DIAGNOSIS — C259 Malignant neoplasm of pancreas, unspecified: Secondary | ICD-10-CM

## 2012-03-30 DIAGNOSIS — R109 Unspecified abdominal pain: Secondary | ICD-10-CM

## 2012-03-30 DIAGNOSIS — C787 Secondary malignant neoplasm of liver and intrahepatic bile duct: Secondary | ICD-10-CM

## 2012-03-30 DIAGNOSIS — K224 Dyskinesia of esophagus: Secondary | ICD-10-CM

## 2012-03-30 LAB — CBC WITH DIFFERENTIAL/PLATELET
BASO%: 1.6 % (ref 0.0–2.0)
EOS%: 3.1 % (ref 0.0–7.0)
HCT: 29 % — ABNORMAL LOW (ref 34.8–46.6)
LYMPH%: 12 % — ABNORMAL LOW (ref 14.0–49.7)
MCH: 27.8 pg (ref 25.1–34.0)
MCHC: 32.4 g/dL (ref 31.5–36.0)
NEUT%: 76.1 % (ref 38.4–76.8)
Platelets: 285 10*3/uL (ref 145–400)
RBC: 3.38 10*6/uL — ABNORMAL LOW (ref 3.70–5.45)
WBC: 9.6 10*3/uL (ref 3.9–10.3)
lymph#: 1.2 10*3/uL (ref 0.9–3.3)

## 2012-03-30 LAB — COMPREHENSIVE METABOLIC PANEL (CC13)
ALT: 14 U/L (ref 0–55)
AST: 22 U/L (ref 5–34)
Alkaline Phosphatase: 168 U/L — ABNORMAL HIGH (ref 40–150)
BUN: 18 mg/dL (ref 7.0–26.0)
Creatinine: 3.9 mg/dL (ref 0.6–1.1)

## 2012-03-30 LAB — CANCER ANTIGEN 19-9: CA 19-9: 283.9 U/mL — ABNORMAL HIGH (ref <35.0)
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
## 2012-03-30 NOTE — Progress Notes (Signed)
Checked in new patient. No financial issues. °

## 2012-03-30 NOTE — Progress Notes (Signed)
Met with patient and family.  Explained role of GI navigator.  Resource sheet with information and contact numbers were provided.  Will continue to follow as needed.

## 2012-03-30 NOTE — Patient Instructions (Addendum)
Hydrocodone APAP  5/325     1/2 to one tablet every 6 hours as needed. Suggest trying 1/2 during day and taking the whole tablet at bedtime.  We will set up swallowing evaluation with speech pathology as outpatient at the hospital, as they can often give helpful suggestions about the swallowing. We will also ask dietician at our office to meet with you; until then, please try the Ensure daily.

## 2012-03-30 NOTE — Telephone Encounter (Signed)
Gave pt appt for January and February 2014 lab,md, and nutritionist. Pt will be see by Baldo Ash speech therapist in his office, pt aware of all appts

## 2012-04-01 ENCOUNTER — Ambulatory Visit: Payer: Medicare Other | Admitting: Nutrition

## 2012-04-01 NOTE — Progress Notes (Signed)
This is an 79 year old female patient of Dr. Darrold Span diagnosed with metastatic pancreatic cancer/dysphasia.  Past medical history includes hypertension, asthma, IBS, GERD, vitamin D deficiency, osteoporosis, dysphasia, chronic kidney disease stage IV on hemodialysis.  Medications include a renal B complex vitamin, Mucinex, Colace, Pepcid, Prilosec, and MiraLax.  Labs include glucose 129, BUN 18, creatinine 3.9, and albumin 2.6 on January 14.  Height: 64 inches. Weight: 104.4 pounds. Usual body weight: 108 pounds. Patient weighed approximately 120 pounds last year. BMI: 17.91.  Patient and her daughter present to nutrition consult. Patient states she has a poor appetite and has difficulty with swallowing solid foods. Patient does tolerate soft canned fruits and vegetables. She drinks one Ensure Plus daily. She tried Nepro but hated it. She typically eats 2 meals a day and appears to follow her renal diet restrictions most of the time. Per daughter, she and patient refer to her renal diet booklet to review diet information as needed.  Nutrition diagnosis: Unintended weight loss related to diagnosis of metastatic cancer and dysphasia as evidenced by weight loss over the past year and BMI of 17.91 which is underweight.  Intervention: I educated patient and daughter on increasing frequency of meals and snacks utilizing allowed foods. I have asked patient to continue Ensure Plus, one can daily, to provide additional calories and protein. I have reviewed meals and snacks that would be appropriate on a renal diet. I've reinforced fluid restrictions. I've encouraged patient's daughter to try pured meat. Patient is scheduled for a speech path evaluation and recommendations on January 23.  Monitoring, evaluation, goals: Patient will tolerate increased oral intake to minimize further weight loss.  Next visit: Patient has my contact information for questions or concerns.

## 2012-04-05 ENCOUNTER — Other Ambulatory Visit (HOSPITAL_COMMUNITY): Payer: Self-pay | Admitting: Nephrology

## 2012-04-05 DIAGNOSIS — N186 End stage renal disease: Secondary | ICD-10-CM

## 2012-04-06 ENCOUNTER — Other Ambulatory Visit (HOSPITAL_COMMUNITY): Payer: Self-pay | Admitting: Nephrology

## 2012-04-06 ENCOUNTER — Encounter (HOSPITAL_COMMUNITY): Payer: Self-pay

## 2012-04-06 ENCOUNTER — Ambulatory Visit (HOSPITAL_COMMUNITY)
Admission: RE | Admit: 2012-04-06 | Discharge: 2012-04-06 | Disposition: A | Discharge status: Home / Self Care | Payer: Medicare Other | Source: Ambulatory Visit | Attending: Nephrology | Admitting: Nephrology

## 2012-04-06 ENCOUNTER — Encounter (HOSPITAL_COMMUNITY): Payer: Self-pay | Admitting: Pharmacy Technician

## 2012-04-06 DIAGNOSIS — J449 Chronic obstructive pulmonary disease, unspecified: Secondary | ICD-10-CM | POA: Insufficient documentation

## 2012-04-06 DIAGNOSIS — T82898A Other specified complication of vascular prosthetic devices, implants and grafts, initial encounter: Secondary | ICD-10-CM | POA: Insufficient documentation

## 2012-04-06 DIAGNOSIS — I120 Hypertensive chronic kidney disease with stage 5 chronic kidney disease or end stage renal disease: Secondary | ICD-10-CM | POA: Insufficient documentation

## 2012-04-06 DIAGNOSIS — N186 End stage renal disease: Secondary | ICD-10-CM | POA: Insufficient documentation

## 2012-04-06 DIAGNOSIS — J4489 Other specified chronic obstructive pulmonary disease: Secondary | ICD-10-CM | POA: Insufficient documentation

## 2012-04-06 DIAGNOSIS — I770 Arteriovenous fistula, acquired: Secondary | ICD-10-CM | POA: Insufficient documentation

## 2012-04-06 DIAGNOSIS — Y832 Surgical operation with anastomosis, bypass or graft as the cause of abnormal reaction of the patient, or of later complication, without mention of misadventure at the time of the procedure: Secondary | ICD-10-CM | POA: Insufficient documentation

## 2012-04-06 LAB — POCT I-STAT 4, (NA,K, GLUC, HGB,HCT)
Glucose, Bld: 101 mg/dL — ABNORMAL HIGH (ref 70–99)
HCT: 32 % — ABNORMAL LOW (ref 36.0–46.0)
Hemoglobin: 10.9 g/dL — ABNORMAL LOW (ref 12.0–15.0)

## 2012-04-06 MED ORDER — ALTEPLASE 2 MG IJ SOLR
4.0000 mg | Freq: Once | INTRAMUSCULAR | Status: AC
  Administered 2012-04-06: 4 mg
  Filled 2012-04-06: qty 4

## 2012-04-06 MED ORDER — IOHEXOL 300 MG/ML  SOLN
100.0000 mL | Freq: Once | INTRAMUSCULAR | Status: AC | PRN
  Administered 2012-04-06: 50 mL via INTRAVENOUS

## 2012-04-06 MED ORDER — MIDAZOLAM HCL 2 MG/2ML IJ SOLN
INTRAMUSCULAR | Status: AC | PRN
  Administered 2012-04-06: 1 mg via INTRAVENOUS
  Administered 2012-04-06: 0.5 mg via INTRAVENOUS

## 2012-04-06 MED ORDER — HEPARIN SODIUM (PORCINE) 1000 UNIT/ML IJ SOLN
INTRAMUSCULAR | Status: AC
  Filled 2012-04-06: qty 1

## 2012-04-06 MED ORDER — MIDAZOLAM HCL 2 MG/2ML IJ SOLN
INTRAMUSCULAR | Status: AC
  Filled 2012-04-06: qty 4

## 2012-04-06 MED ORDER — HEPARIN SODIUM (PORCINE) 1000 UNIT/ML IJ SOLN
INTRAMUSCULAR | Status: AC | PRN
  Administered 2012-04-06: 3000 [IU] via INTRAVENOUS
  Administered 2012-04-06: 2000 [IU] via INTRAVENOUS

## 2012-04-06 MED ORDER — FENTANYL CITRATE 0.05 MG/ML IJ SOLN
INTRAMUSCULAR | Status: AC | PRN
  Administered 2012-04-06 (×2): 25 ug via INTRAVENOUS

## 2012-04-06 MED ORDER — FENTANYL CITRATE 0.05 MG/ML IJ SOLN
INTRAMUSCULAR | Status: AC
  Filled 2012-04-06: qty 4

## 2012-04-06 NOTE — Procedures (Signed)
Technically successful declot of left upper arm dialysis graft requiring placement of venous anastomotic stent due to refractory focal severe stenosis.  No immediate post procedural complications.

## 2012-04-06 NOTE — H&P (Signed)
Brenda Leach is an 79 y.o. female.   Chief Complaint: ESRD Left upper arm graft clotted Last dialysis: 1/17 Attempt: 1/20: clotted Never has had intervention here: moved from [place] 1 yr ago Scheduled for left upper arm graft thrombolysis and possible angioplasty/stent placement. Possible dialysis catheter placement if needed HPI: HTN; COPD; IBS; ESRD; anemia  Past Medical History  Diagnosis Date  . Hypertension   . Asthma   . IBS (irritable bowel syndrome)   . Shortness of breath   . Blood transfusion   . GERD (gastroesophageal reflux disease)   . Arthritis   . Vitamin D deficiency   . Osteoporosis   . Abdominal pain, epigastric   . Extrinsic asthma, unspecified   . Chronic airway obstruction, not elsewhere classified   . Chronic kidney disease, stage IV (severe)   . Insomnia, unspecified   . Other malaise and fatigue   . Anemia     hx of   . Anxiety   . Hx of colonic polyps     Past Surgical History  Procedure Date  . Abdominal hysterectomy   . Neck surgery   . Tumor removal   . Throat surgery     Family History  Problem Relation Age of Onset  . Liver cancer Father   . Stroke Mother   . Colon cancer Neg Hx   . Diabetes Brother   . Heart disease Sister   . Liver disease Father    Social History:  reports that she has never smoked. She has never used smokeless tobacco. She reports that she does not drink alcohol or use illicit drugs.  Allergies:  Allergies  Allergen Reactions  . Excedrin (Aspirin-Acetaminophen-Caffeine) Other (See Comments)    Name brand Excedrin causes dizziness.  No reaction to tylenol     (Not in a hospital admission)  No results found for this or any previous visit (from the past 48 hour(s)). No results found.  Review of Systems  Constitutional: Negative for fever and weight loss.  Respiratory: Positive for cough and sputum production.   Cardiovascular: Negative for chest pain.  Gastrointestinal: Negative for nausea, vomiting and  abdominal pain.  Neurological: Negative for headaches.    Blood pressure 161/81, pulse 76, SpO2 97.00%. Physical Exam  Constitutional: She is oriented to person, place, and time.  Cardiovascular: Normal rate and regular rhythm.   No murmur heard. Respiratory: Effort normal and breath sounds normal. She has no wheezes.  GI: Soft. Bowel sounds are normal. There is no tenderness.  Musculoskeletal: Normal range of motion.       Uses cane Left upper arm graft no pulse/no thrill  Neurological: She is alert and oriented to person, place, and time.  Skin: Skin is warm.  Psychiatric: She has a normal mood and affect. Her behavior is normal. Judgment and thought content normal.     Assessment/Plan ESRD Left upper arm graft clotted Scheduled for thrombolysis and poss pta/stent placement Poss dialysis catheter if needed Pt aware of procedure benefits and risks and agreeable to proceed Consent signed and in chart  Brenda Leach A 04/06/2012, 10:00 AM

## 2012-04-08 ENCOUNTER — Ambulatory Visit (HOSPITAL_COMMUNITY)
Admission: RE | Admit: 2012-04-08 | Discharge: 2012-04-08 | Disposition: A | Discharge status: Home / Self Care | Payer: Medicare Other | Source: Ambulatory Visit | Attending: Oncology | Admitting: Oncology

## 2012-04-08 DIAGNOSIS — R1310 Dysphagia, unspecified: Secondary | ICD-10-CM

## 2012-04-08 DIAGNOSIS — K224 Dyskinesia of esophagus: Secondary | ICD-10-CM

## 2012-04-08 NOTE — Procedures (Addendum)
Objective Swallowing Evaluation: Modified Barium Swallowing Study  Patient Details  Name: Brenda Leach MRN: [id number] Date of Birth: 11/13/1923  Today's Date: 04/08/2012 Time: 7829-5621 SLP Time Calculation (min): 40 min  Past Medical History:  Past Medical History  Diagnosis Date  . Hypertension   . Asthma   . IBS (irritable bowel syndrome)   . Shortness of breath   . Blood transfusion   . GERD (gastroesophageal reflux disease)   . Arthritis   . Vitamin D deficiency   . Osteoporosis   . Abdominal pain, epigastric   . Extrinsic asthma, unspecified   . Chronic airway obstruction, not elsewhere classified   . Chronic kidney disease, stage IV (severe)   . Insomnia, unspecified   . Other malaise and fatigue   . Anemia     hx of   . Anxiety   . Hx of colonic polyps    Past Surgical History:  Past Surgical History  Procedure Date  . Abdominal hysterectomy   . Neck surgery   . Tumor removal   . Throat surgery    HPI:  79 yo female referred by Dr Darrold Span for MBS per pt's daughter request.  Pt has metastatic pancreatic cancer involving liver and lungs and dysphagia.  Per note from RD, pt tolerates soft canned fruits/vegetables and drinks one can of Ensure Plus daily.  Pt typically eats 2 meals daily and adheres to renal restrictions.  RD educated pt and daughter to increased pt's snack she consumes daily with renal diet appopriate foods. Significant weight loss reported by pt- unintended.  Esophagram  recently showed dysmotilty and decreased relaxation of LES.  Pt states she drinks water during meals to help clear sensation of pharyngeal stasis.  Today she presents with right sided pain, which dtr states is due to her metastatic disease.  Pt did not take pain medicine today as she did not want to be sleepy for this test.  Frequent belching with meals, sensation of food stasis in pharyx reported.       Assessment / Plan / Recommendation Clinical Impression  Dysphagia Diagnosis:  Within Functional Limits;Suspected primary esophageal dysphagia Clinical impression: Pt presents with functional oropharyngeal swallow ability without aspiration or penetration of any consistency tested (ba tablet, cracker, pudding, nectar and thin).  Swallow was timely and strong without residuals.  Pt has undergone barium swallow previously that showed dysmotility and decreased LES relaxation.  Her sensation of stasis to pharynx is referrant from distal esophagus due to vagal nerve function during today's MBS.  Esophagus appeared with retained barium throughout most of eval and pt only sensed large amounts of stasis and barium tablet that appeared lodged at LES.  Mulitple boluses of thin water aided clearance of tablet, but this was delayed.  She may benefit from liquids medications or crushing if large and not contraindicated - followed by plenty of water.  SLP agrees with recommendations per dietician, including consuming  several small meals each day.  Also advised pt to consume liquids throughout meal if helpful to clear and to take rest breaks if sensing fullness at phraynx that does not clear.  Educated pt and daughter to eval results and recommendations throughout testing.   Small amounts throughout the day may be helpful to compensate for known dysmotility esophageal issues.  Follow up with GI recommended to determine if medical tx indicated for decreased LES relaxation and dysmotility (per previous ba swallow).      Treatment Recommendation       Diet  Recommendation Thin liquid;Dysphagia 3 (Mechanical Soft);Regular (solids as tolerated)   Liquid Administration via: Cup (pt dislikes using straws) Supervision: Patient able to self feed Compensations: Slow rate;Small sips/bites;Follow solids with liquid (frequent meals t/o day) Postural Changes and/or Swallow Maneuvers: Seated upright 90 degrees;Upright 30-60 min after meal    Other  Recommendations Recommended Consults: Consider GI evaluation  (Dr Rhea [facility]ton [facility] listed) Oral Care Recommendations: Oral care BID   Follow Up Recommendations    n/a   Frequency and Duration   n/a     Pertinent Vitals/Pain Appears comfortable, on room air, wheelchair in room    SLP Swallow Goals  n/a eval, educate and dc   General Date of Onset: 04/08/12 HPI: 79 yo female referred by Dr Darrold Span for MBS per pt's daughter request.  Pt has metastatic pancreatic cancer involving liver and lungs and dysphagia.  Per note from RD, pt tolerates soft canned fruits/vegetables and drinks one can of Ensure Plus daily.  Pt typically eats 2 meals daily and adheres to renal restrictions.  RD educated pt and daughter to increased pt's snack she consumes daily with renal diet appopriate foods. Significant weight loss reported by pt- unintended.  Esophagram  recently showed dysmotilty and decreased relaxation of LES.  Pt states she drinks water during meals to help clear sensation of pharyngeal stasis.  Today she presents with right sided pain, which dtr states is due to her metastatic disease.  Pt did not take pain medicine today as she did not want to be sleepy for this test.  Frequent belching with meals, sensation of food stasis in pharyx reported.   Type of Study: Modified Barium Swallowing Study Reason for Referral: Objectively evaluate swallowing function Diet Prior to this Study: Regular;Thin liquids Temperature Spikes Noted: No Respiratory Status: Room air Behavior/Cognition: Alert;Cooperative;Pleasant mood Oral Cavity - Dentition: Dentures, top;Dentures, bottom Oral Motor / Sensory Function: Within functional limits Self-Feeding Abilities: Able to feed self Patient Positioning: Upright in chair Baseline Vocal Quality: Clear Volitional Cough: Strong Volitional Swallow: Able to elicit Anatomy: Within functional limits Pharyngeal Secretions: Not observed secondary MBS    Reason for Referral Objectively evaluate swallowing function   Oral Phase Oral  Preparation/Oral Phase Oral Phase: WFL   Pharyngeal Phase Pharyngeal Phase Pharyngeal Phase: Within functional limits  Cervical Esophageal Phase    GO    Cervical Esophageal Phase Cervical Esophageal Phase: WFL (appearance of decreased clearance, intermittent sensation)    Functional Assessment Tool Used: MBS, clinical judgement Functional Limitations: Swallowing Swallow Current Status (J1914): At least 40 percent but less than 60 percent impaired, limited or restricted Swallow Goal Status [phone]): At least 40 percent but less than 60 percent impaired, limited or restricted Swallow Discharge Status [phone]): At least 40 percent but less than 60 percent impaired, limited or restricted    Donavan Burnet, MS [facility] SLP [phone]

## 2012-04-09 ENCOUNTER — Telehealth: Payer: Self-pay

## 2012-04-09 ENCOUNTER — Telehealth: Payer: Self-pay | Admitting: Medical Oncology

## 2012-04-09 ENCOUNTER — Telehealth: Payer: Self-pay | Admitting: Internal Medicine

## 2012-04-09 DIAGNOSIS — R52 Pain, unspecified: Secondary | ICD-10-CM

## 2012-04-09 MED ORDER — HYDROCODONE-ACETAMINOPHEN 5-325 MG PO TABS: 1.0000 | ORAL_TABLET | Freq: Four times a day (QID) | ORAL | Status: AC | PRN

## 2012-04-09 NOTE — Telephone Encounter (Signed)
Told Brenda Leach information noted below by Dr. Darrold Span.  Brenda Leach prefers our office to set up GI appointment with Dr. Rhea Belton. Called [facility]. Dr. Margretta Sidle does not have an opening until 04-27-12. Message will be given to his nurse Aram Beecham to see if appt. can be scheduled on 1-28; 30or 04-20-12 in am as patient has dialysis on M-W-F.

## 2012-04-09 NOTE — Telephone Encounter (Signed)
Daughter reports pt is taking 1-2 tablets of hydrocodone /day. I called in refill for #20

## 2012-04-09 NOTE — Telephone Encounter (Signed)
Message copied by Lorine Bears on Fri Apr 09, 2012  4:32 PM ------      Message from: Reece Packer      Created: Fri Apr 09, 2012  9:15 AM       Labs seen and need follow up: please let pt/ daughter know that I saw the speech path report re swallowing problems. Those recommendations seem like helpful ideas. I also see that the speech pathologist thought GI follow up would be useful. We can set this up or patient/daughter can call Dr Rhea Belton, with [facility], who knows her. I would suggest getting this appointment at least set up now, rather than waiting until I see her back on Feb 4.  thanks

## 2012-04-12 NOTE — Telephone Encounter (Signed)
Brenda Leach. with Dr. Rhea Belton called and will work in Brenda Leach tomorrow.  Brenda Leach will call daughter Brenda Leach to set up appt.  Brenda Leach Rose's  Phone number.

## 2012-04-12 NOTE — Telephone Encounter (Signed)
lmom for Dr Precious Reel nurse to call back for an appt.

## 2012-04-12 NOTE — Telephone Encounter (Signed)
Sallye Ober, RN for Dr Darrold Span asked me to call Dub Mikes, pt's daughter for an appt. Scheduled pt to see Mike Gip, PA tomorrow afternoon after she goes to Speech Therapy. Daughter stated understanding.

## 2012-04-13 ENCOUNTER — Ambulatory Visit: Payer: Medicare Other

## 2012-04-13 ENCOUNTER — Ambulatory Visit: Payer: Medicare Other | Admitting: Physician Assistant

## 2012-04-13 ENCOUNTER — Ambulatory Visit (INDEPENDENT_AMBULATORY_CARE_PROVIDER_SITE_OTHER): Payer: Medicare Other | Admitting: Physician Assistant

## 2012-04-13 ENCOUNTER — Telehealth: Payer: Self-pay | Admitting: Oncology

## 2012-04-13 ENCOUNTER — Telehealth: Payer: Self-pay

## 2012-04-13 ENCOUNTER — Encounter: Payer: Self-pay | Admitting: Physician Assistant

## 2012-04-13 VITALS — BP 120/62 | HR 72 | Ht 64.0 in | Wt 103.2 lb

## 2012-04-13 DIAGNOSIS — R1310 Dysphagia, unspecified: Secondary | ICD-10-CM

## 2012-04-13 DIAGNOSIS — R1084 Generalized abdominal pain: Secondary | ICD-10-CM

## 2012-04-13 DIAGNOSIS — R52 Pain, unspecified: Secondary | ICD-10-CM

## 2012-04-13 DIAGNOSIS — R630 Anorexia: Secondary | ICD-10-CM

## 2012-04-13 DIAGNOSIS — C259 Malignant neoplasm of pancreas, unspecified: Secondary | ICD-10-CM | POA: Insufficient documentation

## 2012-04-13 DIAGNOSIS — C787 Secondary malignant neoplasm of liver and intrahepatic bile duct: Secondary | ICD-10-CM

## 2012-04-13 NOTE — Patient Instructions (Addendum)
Take Miralax daily for bowels.  17 grams in 8 oz of water.  Increase your pain medication to 1 tab every 4 hours as needed for pain.  For now, eat a thick liquid to very soft diet.  Eat what appeals to you. Do more frequent small feedings. Sit straight up when eating.

## 2012-04-13 NOTE — Telephone Encounter (Signed)
Rehab called and wants me to inform patient regarding appt today not needed, informed patient and nurse,

## 2012-04-13 NOTE — Telephone Encounter (Signed)
Left a message for Ms. Jarold Motto that Dr. Darrold Span said for her mother to see her dentist for this problem.  It is not necessary for her to see Dr. Darrold Span.  She can call this office tomorrow if she has has questions.

## 2012-04-13 NOTE — Progress Notes (Addendum)
Subjective:    Patient ID: Brenda Leach, female    DOB: 1923-11-17, 79 y.o.   MRN: [id number]  HPI Brenda Leach is a very nice 79 year old African American female known to Dr. Rhea Belton with history of end-stage renal disease on dialysis over the past several years. She also has COPD. She has been having abdominal pain over the past several months and was recently diagnosed with a metastatic pancreatic adenocarcinoma to the liver and lung. This is been biopsy proven to the liver and radiographically proven to the lung area she is now being followed by Dr.Livesay She is not felt to be a candidate for chemotherapy, and supportive management has been initiated. She has just recently started on pain medication and states that this does help but only lasts  for 4-5 hours and then her pain is back. She  seems to be more uncomfortable at night and also with lying down. Her daughter is concerned that she doesn't seem to have any appetite. She has also had some complaints of difficulty swallowing. She had had recent speech path swallowing study done which did not show any issues and is therefore esophageal origin of her dysphagia is suspected. Prior to that she had undergone a regular barium swallow on 03/23/2012 and this showed limited clearing of her esophagus into her stomach with infrequent relaxation of the LES but no  fixed stricture, and was felt to be consistent with dysmotility. Patient states that she has no difficulty swallowing liquids but does have trouble with solids and says that foods will sit in her esophagus for a long time . She  is not having any odynophagia. She also states that she generally just not hungry and at most  foods are not appealing to her. She has had some constipation especially with the addition of pain medicines and has been using MiraLaxand prune juice with good results.   Review of Systems  Constitutional: Positive for activity change, appetite change and fatigue.  HENT: Positive  for trouble swallowing.   Eyes: Negative.   Respiratory: Positive for cough.   Cardiovascular: Negative.   Gastrointestinal: Positive for abdominal pain and constipation.  Genitourinary: Negative.   Musculoskeletal: Negative.   Skin: Negative.   Neurological: Negative.   Hematological: Negative.   Psychiatric/Behavioral: Negative.    Outpatient Prescriptions Prior to Visit  Medication Sig Dispense Refill  . acetaminophen (TYLENOL) 325 MG tablet Take 325-650 mg by mouth every 6 (six) hours as needed. For pain      . albuterol (PROAIR HFA) 108 (90 BASE) MCG/ACT inhaler Inhale 2 puffs into the lungs every 4 (four) hours as needed for wheezing or shortness of breath.      Marland Kitchen aspirin EC 81 MG tablet Take 81 mg by mouth daily.      . B Complex-C-Folic Acid (RENAL SOFTGELS) 1 MG CAPS Take 1 capsule (1 mg total) by mouth daily.      . budesonide (PULMICORT) 0.5 MG/2ML nebulizer solution Take 2 mLs (0.5 mg total) by nebulization 2 (two) times daily.  120 mL  11  . chlorpheniramine (CHLOR-TRIMETON) 2 MG/5ML syrup Take 4 mg by mouth at bedtime.      Marland Kitchen dextromethorphan-guaiFENesin (MUCINEX DM) 30-600 MG per 12 hr tablet Take 1 tablet by mouth every 12 (twelve) hours as needed. For congestion      . docusate sodium (COLACE) 100 MG capsule Take 100 mg by mouth daily as needed. For constipation      . famotidine (PEPCID) 20 MG tablet Take  20 mg by mouth at bedtime.      . fluocinolone (FLUOCINOLONE ACETONIDE SCALP) 0.01 % external oil Apply 1 application topically daily. Not on dialysis days      . fluticasone (FLONASE) 50 MCG/ACT nasal spray Place 2 sprays into the nose daily as needed. For stuffiness      . formoterol (PERFOROMIST) 20 MCG/2ML nebulizer solution Take 20 mcg by nebulization 2 (two) times daily.      Marland Kitchen guaiFENesin-dextromethorphan (ROBITUSSIN DM) 100-10 MG/5ML syrup Take 5 mLs by mouth 3 (three) times daily as needed. For congestion      . hydrALAZINE (APRESOLINE) 25 MG tablet Take 25 mg  by mouth 3 (three) times daily.       Marland Kitchen HYDROcodone-acetaminophen (NORCO/VICODIN) 5-325 MG per tablet Take 1 tablet by mouth every 6 (six) hours as needed for pain.  20 tablet  0  . lanthanum (FOSRENOL) 500 MG chewable tablet Chew 1,000 mg by mouth daily. (chew 2 daily except on days pt has dialysis per kidney Dr)      . montelukast (SINGULAIR) 10 MG tablet Take 10 mg by mouth at bedtime.       . Nebivolol HCl (BYSTOLIC) 20 MG TABS Take 20 mg by mouth daily.       Marland Kitchen NIFEdipine (PROCARDIA XL/ADALAT-CC) 60 MG 24 hr tablet Take 60 mg by mouth 2 (two) times daily.      Marland Kitchen omeprazole (PRILOSEC) 20 MG capsule Take 20 mg by mouth daily. 30-60 minutes before first meal of the day      . polyethylene glycol (MIRALAX / GLYCOLAX) packet Take 17 g by mouth daily as needed. For constipation,  Dissolve in 8 oz of liquid and drink      . telmisartan (MICARDIS) 80 MG tablet Take 1 tablet (80 mg total) by mouth at bedtime.       Last reviewed on 04/13/2012  3:37 PM by Sammuel Cooper, PA  Allergies  Allergen Reactions  . Excedrin (Aspirin-Acetaminophen-Caffeine) Other (See Comments)    Name brand Excedrin causes dizziness.  No reaction to tylenol   Patient Active Problem List  Diagnosis  . ESRD (end stage renal disease) on dialysis  . IBS (irritable bowel syndrome)  . Hypertension  . Nausea & vomiting  . Lower abdominal pain  . Severe persistent asthma  . GERD (gastroesophageal reflux disease)  . Cough  . Dyspnea  . Constipation  . Pulmonary nodules  . GI malignancy  . Pancreatic cancer metastasized to liver   History  Substance Use Topics  . Smoking status: Never Smoker   . Smokeless tobacco: Never Used  . Alcohol Use: No       Objective:   Physical Exam well-developed elderly chronically ill-appearing white female in no acute distress, very thin accompanied by her daughter. Blood pressure 120/62 pulse 72 height 5 foot 4 weight 103. HEENT; nontraumatic normocephalic EOMI PERRLA sclera  anicteric,Neck; Supple no JVD, Cardiovascular; regular rate and rhythm with S1-S2 soft systolic murmur occasional ectopic, Pulmonary; few scattered rhonchi and crackles in the left base, Abdomen; soft she is tender across the upper abdomen no guarding or rebound no definite palpable mass or hepatosplenomegaly, Extremities; no clubbing cyanosis or edema skin warm and dry, Psych; mood and affect normal and appropriate.        Assessment & Plan:  #73 79 year old female with recently diagnosed metastatic adenocarcinoma of the pancreas; metastatic to the liver and lung. #2 dysphasia-this is primarily to solid foods, and recent barium swallow consistent  with esophageal dysmotility and in frequent relaxation of the LES; no stricture #3 progressive  abdominal pain secondary to #1 #4 anorexia secondary to #1 #5 end-stage renal disease on dialysis  Plan; discussed the situation with the patient and her daughter, do not think that endoscopy will offer her any benefit. Have suggested full liquid 2 very soft foods only with very small more frequent feedings, and allowing her to eat whatever appeals to her. Also suggested Ensure or boost once or twice daily or additional protein. Upright position for all meals. Patient and daughter are advised that pain should not be an issue for her and that she should take pain medicine regularly and that it is fine to use her hydrocodone every 4 hours rather than every 6 hours if this helps. She may followup with Dr. Rhea Belton  or myself on an as-needed basis  Addendum: Reviewed and agree with management. Beverley Fiedler, MD

## 2012-04-16 ENCOUNTER — Other Ambulatory Visit (HOSPITAL_COMMUNITY): Payer: Self-pay | Admitting: Nephrology

## 2012-04-16 DIAGNOSIS — N186 End stage renal disease: Secondary | ICD-10-CM

## 2012-04-17 ENCOUNTER — Other Ambulatory Visit (HOSPITAL_COMMUNITY): Payer: Self-pay | Admitting: Nephrology

## 2012-04-17 ENCOUNTER — Encounter (HOSPITAL_COMMUNITY): Payer: Self-pay

## 2012-04-17 ENCOUNTER — Ambulatory Visit (HOSPITAL_COMMUNITY)
Admission: RE | Admit: 2012-04-17 | Discharge: 2012-04-17 | Disposition: A | Discharge status: Home / Self Care | Payer: Medicare Other | Source: Ambulatory Visit | Attending: Nephrology | Admitting: Nephrology

## 2012-04-17 VITALS — BP 141/57 | HR 69 | Resp 20

## 2012-04-17 DIAGNOSIS — T82898A Other specified complication of vascular prosthetic devices, implants and grafts, initial encounter: Secondary | ICD-10-CM | POA: Insufficient documentation

## 2012-04-17 DIAGNOSIS — Y832 Surgical operation with anastomosis, bypass or graft as the cause of abnormal reaction of the patient, or of later complication, without mention of misadventure at the time of the procedure: Secondary | ICD-10-CM | POA: Insufficient documentation

## 2012-04-17 DIAGNOSIS — N186 End stage renal disease: Secondary | ICD-10-CM

## 2012-04-17 DIAGNOSIS — I120 Hypertensive chronic kidney disease with stage 5 chronic kidney disease or end stage renal disease: Secondary | ICD-10-CM | POA: Insufficient documentation

## 2012-04-17 DIAGNOSIS — Z992 Dependence on renal dialysis: Secondary | ICD-10-CM | POA: Insufficient documentation

## 2012-04-17 MED ORDER — IOHEXOL 300 MG/ML  SOLN
100.0000 mL | Freq: Once | INTRAMUSCULAR | Status: AC | PRN
  Administered 2012-04-17: 30 mL via INTRAVENOUS

## 2012-04-17 MED ORDER — HEPARIN SODIUM (PORCINE) 1000 UNIT/ML IJ SOLN
INTRAMUSCULAR | Status: AC
  Filled 2012-04-17: qty 1

## 2012-04-17 MED ORDER — MIDAZOLAM HCL 2 MG/2ML IJ SOLN
INTRAMUSCULAR | Status: AC
  Filled 2012-04-17: qty 4

## 2012-04-17 MED ORDER — ALTEPLASE 100 MG IV SOLR
2.0000 mg | Freq: Once | INTRAVENOUS | Status: DC
  Filled 2012-04-17: qty 2

## 2012-04-17 MED ORDER — FENTANYL CITRATE 0.05 MG/ML IJ SOLN
INTRAMUSCULAR | Status: AC
  Filled 2012-04-17: qty 4

## 2012-04-17 NOTE — Procedures (Signed)
Shuntogram Art and venous PTA No complication No blood loss. See complete dictation in [facility].

## 2012-04-17 NOTE — H&P (Signed)
Brenda Leach is an 79 y.o. female.   Chief Complaint: Left upper arm graft clotted But this am has noticed slight pulse Last use 1/31: clotted Slight pulse now Last intervention: 04/06/12; successful Scheduled for left upper arm dialysis graft thrombolysis and possible angioplasty/stent Possible dialysis catheter if needed HPI: ESRD; HTN; IBS; asthma; osteoporosis; pancreatic ca  Past Medical History  Diagnosis Date  . Hypertension   . Asthma   . IBS (irritable bowel syndrome)   . Shortness of breath   . Blood transfusion   . GERD (gastroesophageal reflux disease)   . Arthritis   . Vitamin D deficiency   . Osteoporosis   . Abdominal pain, epigastric   . Extrinsic asthma, unspecified   . Chronic airway obstruction, not elsewhere classified   . Chronic kidney disease, stage IV (severe)   . Insomnia, unspecified   . Other malaise and fatigue   . Anemia     hx of   . Anxiety   . Hx of colonic polyps   . Cancer     behind the pancreas  . Pancreatic cancer     Past Surgical History  Procedure Date  . Abdominal hysterectomy   . Neck surgery   . Tumor removal   . Throat surgery     Family History  Problem Relation Age of Onset  . Liver cancer Father   . Stroke Mother   . Colon cancer Neg Hx   . Diabetes Brother   . Heart disease Sister   . Liver disease Father    Social History:  reports that she has never smoked. She has never used smokeless tobacco. She reports that she does not drink alcohol or use illicit drugs.  Allergies:  Allergies  Allergen Reactions  . Excedrin (Aspirin-Acetaminophen-Caffeine) Other (See Comments)    Name brand Excedrin causes dizziness.  No reaction to tylenol     (Not in a hospital admission)  No results found for this or any previous visit (from the past 48 hour(s)). No results found.  Review of Systems  Constitutional: Positive for weight loss. Negative for fever.  Respiratory: Positive for cough, shortness of breath and  wheezing.   Cardiovascular: Negative for chest pain.  Gastrointestinal: Negative for nausea, vomiting and abdominal pain.  Neurological: Positive for weakness. Negative for headaches.    There were no vitals taken for this visit. Physical Exam  Constitutional: She is oriented to person, place, and time.  Cardiovascular: Normal rate, regular rhythm and normal heart sounds.   No murmur heard. Respiratory: Effort normal. She has wheezes.  GI: Soft. Bowel sounds are normal. There is no tenderness.  Musculoskeletal: Normal range of motion.       Left upper arm graft minimal pulse  Neurological: She is alert and oriented to person, place, and time.  Psychiatric: She has a normal mood and affect. Her behavior is normal. Judgment and thought content normal.     Assessment/Plan Left upper arm dial graft clotted; slow Scheduled for thrombolysis and poss pta/stent Poss dial cath if needed Pt aware of procedure benefits and risks and agreeable to proceed Consent signed and in chart  Livingston Denner A 04/17/2012, 11:31 AM

## 2012-04-19 ENCOUNTER — Other Ambulatory Visit (HOSPITAL_COMMUNITY): Payer: Self-pay | Admitting: Nephrology

## 2012-04-19 DIAGNOSIS — N186 End stage renal disease: Secondary | ICD-10-CM

## 2012-04-20 ENCOUNTER — Encounter: Payer: Self-pay | Admitting: Oncology

## 2012-04-20 ENCOUNTER — Ambulatory Visit (HOSPITAL_BASED_OUTPATIENT_CLINIC_OR_DEPARTMENT_OTHER): Payer: Medicare Other | Admitting: Oncology

## 2012-04-20 ENCOUNTER — Other Ambulatory Visit: Payer: Self-pay

## 2012-04-20 VITALS — BP 122/57 | HR 74 | Temp 96.9°F | Resp 20 | Ht 62.0 in | Wt 102.4 lb

## 2012-04-20 DIAGNOSIS — C7800 Secondary malignant neoplasm of unspecified lung: Secondary | ICD-10-CM

## 2012-04-20 DIAGNOSIS — N186 End stage renal disease: Secondary | ICD-10-CM

## 2012-04-20 DIAGNOSIS — C787 Secondary malignant neoplasm of liver and intrahepatic bile duct: Secondary | ICD-10-CM

## 2012-04-20 DIAGNOSIS — C259 Malignant neoplasm of pancreas, unspecified: Secondary | ICD-10-CM

## 2012-04-20 DIAGNOSIS — K649 Unspecified hemorrhoids: Secondary | ICD-10-CM

## 2012-04-20 MED ORDER — HYDROCORTISONE 2.5 % RE CREA: TOPICAL_CREAM | Freq: Two times a day (BID) | RECTAL | Status: AC

## 2012-04-20 NOTE — Patient Instructions (Signed)
For the hemorrhoid:  Sitz baths if possible.  Baby wipes instead of toilet paper.  Anusol HC cream twice daily

## 2012-04-20 NOTE — Progress Notes (Signed)
OFFICE PROGRESS NOTE   04/20/2012   Physicians:  INTERVAL HISTORY: M.Wert, J.Pyrtle,M.Mattingly, M.Pandey  Patient is seen, together with daughter, in continuing attention to her metastatic pancreatic carcinoma involving liver and lung, being treated in palliative attempt without chemotherapy at patient's decision. Pain is controlled with hydrocodone 5/325 about every 6 hours. Appetite is poor in addition to the swallowing difficulty, and she has "no energy". She is mildly short of breath at rest.  Patient had abdominal pain for past year, with GI evaluation by Dr Rhea Belton and symptoms initially improved with increased laxatives. She  lost 60 lbs in the year prior to moving to [facility] Nov 2012, but then fairly stable weight between ~ 104 and 110 since she has been in [place].  CXR 02-02-13 showed multiple bilateral lung nodules, with CT chest 02-10-12 showing innumerable bilateral pulmonary nodules, including right lung base nodule measuring 1.3 cm and multiple hypodense liver lesions as well as retrocrural and AP window adenopathy.  PET 03-05-12 had hypermetabolic uptake in pulmonary nodules, mediastinal and paratracheal lymph nodes, liver, left iliac and L2 vertebral body as well as large hypermetabolic mass in region of pancreas with SUV of 8.5. She had US biopsy of liver on 03-16-12 with path ([id number]) adenocarcinoma with immunoprofile consistent with cholangiocarcinoma or pancreaticobiliary primary. Patient understands situation and does not want aggressive treatment. 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   Patient had problems with dialysis graft this past week, which required extended dialysis sessions and IR procedures, tho it is functioning again now. She had swallowing study and GI visit since I saw her last, with esophageal dysmotility. She is doing some better swallowing liquids and soft foods, including Ensure, with recommendations from  speech path and GI. Pain is across abdomen and into back, but she is comfortable after just the low dose hydrocodone. She has some NP cough, is more SOB with exertion, and is more comfortable with O2 on during dialysis. She does not have O2 at home, but would like this.She has a new skin nodule left shoulder and has had bleeding with BM x 2-3. She denies nausea, fever, LE swelling. Remainder of 10 point Review of Systems negative.  Objective:  Vital signs in last 24 hours:  BP 122/57  Pulse 74  Temp 96.9 F (36.1 C) (Oral)  Resp 20  Ht 5\' 2"  (1.575 m)  Wt 102 lb 6.4 oz (46.448 kg)  BMI 18.73 kg/m2  Weight is down 2 lbs. Mildly dyspneic at rest in [facility] on RA. Alert, appropriate, does not appear in any acute discomfort. Daughter very supportive. HEENT:PERRLA, sclera clear, anicteric and oropharynx clear, no lesions LymphaticsCervical, supraclavicular, and axillary nodes normal. Resp: clear to auscultation bilaterally Cardio: regular rate and rhythm GI: soft, quiet, not tender to gentle palpation. Rectal area with external soft hemorrhoid,  slightly tender, no fluctuance. Extremities: no pitting edema, cords, tenderness Neuro: moves all extremities, no focal deficits on exam just in [facility] Skin without rash or ecchymosis. 0.8 cm nodular nontender firm slightly erythematous lesion left posterior shoulder   Lab Results:  None repeated today  Studies/Results:  Swallowing study 04-09-12 reviewed  Medications: I have reviewed the patient's current medications. As she is comfortable with just hydrocodone now, will continue. We discussed long lasting pain medication, which may be helpful later.  Patient and daughter are very open to hospice if possible. Following this visit, I have spoken with Hospice intake and with their medical director; she is eligible  for Hospice even with ongoing hemodialysis as that is not related to the cancer diagnosis. I have requested home O2 prn. I will see her back  at any time in addition to assisting Hospice with her ongoing care, however I have not given her a scheduled return appointment .  Assessment/Plan:  1. Metastatic adenocarcinoma of pancreas (region of uncinate process) metastatic thruout liver and to lungs bilaterally, in an 79 yo lady symptomatic with pain and weight loss, and with other significant comorbidities. Appreciate hospice assistance.  2.ESRD (stage 4 CKD) secondary to glomerulonephritis: on hemodialysis since 2003.  3.Living Will and HCPOA in place.  4.esophageal dysmotility, symptomatic with swallowing problems in addition to poor appetite. The poor po intake is particularly concerning to daughter, and we have discussed fact that this is not unexpected given situation. 5.hemorrhoid; recommended sitz baths and anusol HC 6.flu vaccine done  Patient and daughter were comfortable with discussion and plan. Reece Packer, MD   04/20/2012, 4:43 PM

## 2012-04-20 NOTE — Progress Notes (Signed)
Told daughter Brenda Leach that the Anusol cream was sent to their pharmacy this evening. Brenda Leach said that Dr. Darrold Span was going to give Brenda a prescription for pain medication.  She was told that she could use the hydrocodone 5/325mg  every hour for pain if needed.   Told Brenda Leach that a message will be given to Dr. Darrold Span to clarify the  pain med prescription as the tylenol would be too much in a 24 hr. period if taken  hourly.   Brenda Leach verbalized understanding.   Brenda Leach has been using 1 hydrocodone 5/325 mg tablet  ~4 times in 24 hrs.

## 2012-04-21 ENCOUNTER — Other Ambulatory Visit: Payer: Self-pay | Admitting: *Deleted

## 2012-04-21 ENCOUNTER — Telehealth: Payer: Self-pay | Admitting: *Deleted

## 2012-04-21 DIAGNOSIS — C787 Secondary malignant neoplasm of liver and intrahepatic bile duct: Secondary | ICD-10-CM

## 2012-04-21 MED ORDER — MORPHINE SULFATE (CONCENTRATE) 20 MG/ML PO SOLN: ORAL | Status: AC

## 2012-04-21 NOTE — Telephone Encounter (Signed)
Spoke with Larita Fife from Hospice to let her know Roxanol has been ordered and O2 approved.

## 2012-04-21 NOTE — Telephone Encounter (Signed)
Spoke with daughter, Okey Dupre to confirm that she has spoken with Hospice nurse about new pain medicine. Rose states that evening nurse will show her how to administer Roxinol.  Also let her know that I will call in order for Anusol HC cream for hemorrhoid and Dr Darrold Span has given order for oxygen use as needed. To call us if she has any questions

## 2012-04-21 NOTE — Telephone Encounter (Signed)
Message copied by Phillis Knack on Wed Apr 21, 2012 11:25 AM ------      Message from: Reece Packer      Created: Tue Apr 20, 2012  9:04 PM       Please let daughter know that Hospice is glad to meet them and help -- they will call her to set up appointment, likely in next day or so.  Please also call in anusol HC cream to use bid on the hemorrhoid. Fne to call in hydrocodone 5/325 1-2 every 4-6 hours      # 40 if she needs more.      thanks

## 2012-04-21 NOTE — Telephone Encounter (Signed)
Lynn from Hospice called to say that [facility] in [place] has Roxinol 30 ml bottle available. Suggested dosing 20mg /ml; give 0.75ml-0.5ml every 4 hours prn SOB or pain. Will need to fax prescription to [facility] (phone # [phone]) Also wants to know if Dr Darrold Span is OK with prn O2, 2 liters prn SOB.

## 2012-04-21 NOTE — Telephone Encounter (Signed)
Requesting orders for prn oxygen for dyspnea and flow rates (has COPD) In last 24 hours Vicodin 5/325 not effective for pain control. Would MD write for something scheduled and prn? Fax to [facility] [facility] and call Hospice nurse with orders please.

## 2012-05-04 ENCOUNTER — Telehealth: Payer: Self-pay

## 2012-05-05 ENCOUNTER — Encounter: Payer: Self-pay | Admitting: Oncology

## 2012-05-05 NOTE — Progress Notes (Unsigned)
Medical oncology note of information  Patient died May 18, 2012 per Hospice.  Letter written to daughter and will notify MDs involved.  Ila Mcgill, MD

## 2012-05-15 NOTE — Telephone Encounter (Signed)
Brenda Leach called stating that Brenda Leach passed this am at 0915.  It was expected.

## 2012-05-15 DEATH — deceased

## 2013-05-31 IMAGING — PT NM PET TUM IMG INITIAL (PI) SKULL BASE T - THIGH
6 series · 25 of 25 positions shown · non-contrast
Comparison: T thorax 01/21/2012, CT abdomen 04/11/2011

CLINICAL DATA: Initial treatment strategy for pulmonary nodules and
liver metastasis.  Unknown primary.

NUCLEAR MEDICINE PET SKULL BASE TO THIGH
Fasting Blood Glucose:  102
TECHNIQUE: 17.9 mCi F-18 FDG was injected intravenously.   CT data
was obtained and used for attenuation correction and anatomic
localization only.  (This was not acquired as a diagnostic CT
examination.) Additional exam technical data entered  on
technologist worksheet.

[Series 1: pet ac · axial · 3.3mm · 4.69mm/px · z∈[-779,-53]mm · 5 of 223 slices shown]
[im 1/223]
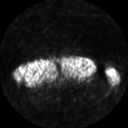
[im 56/223]
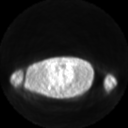
[im 112/223]
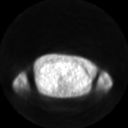
[im 167/223]
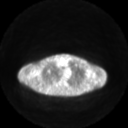
[im 223/223]
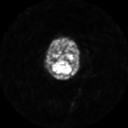

[Series 2: ct images · axial · 3.8mm · 0.98mm/px · z∈[-779,-54]mm · 5 of 223 slices shown]
[im 1/223]
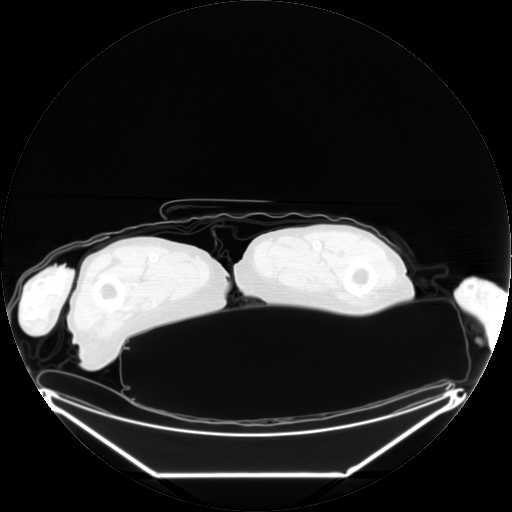
[im 56/223]
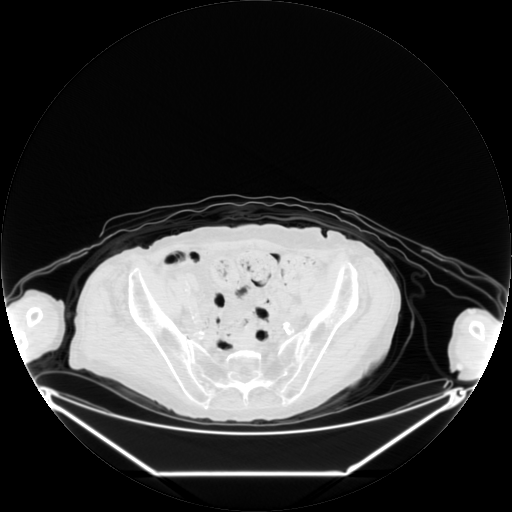
[im 112/223]
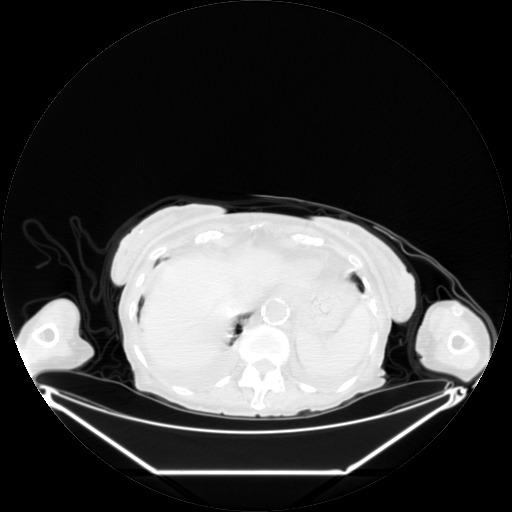
[im 167/223]
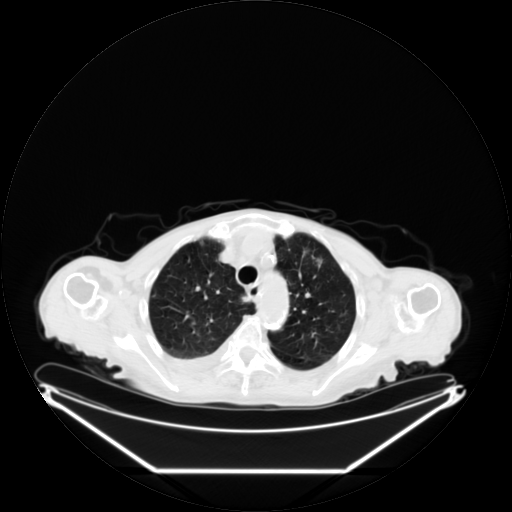
[im 223/223]
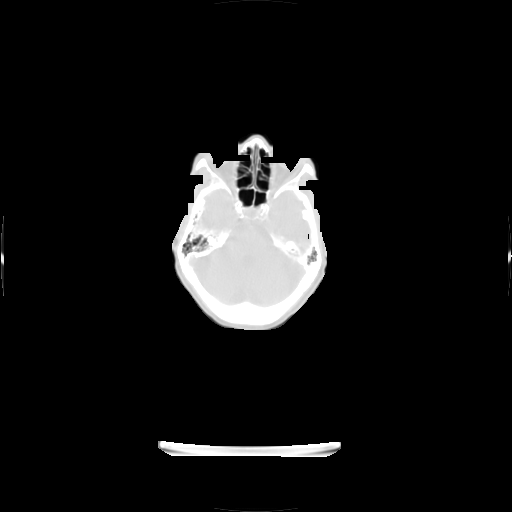

[Series 2: pet nac · axial · 3.3mm · 4.69mm/px · z∈[-779,-53]mm · 6 of 223 slices shown]
[im 1/223]
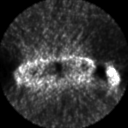
[im 45/223]
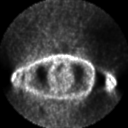
[im 89/223]
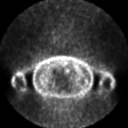
[im 134/223]
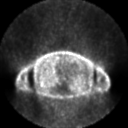
[im 178/223]
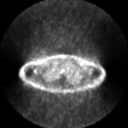
[im 223/223]
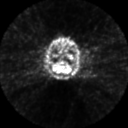

[Series 123: mip · coronal · 3.3mm · 4.69mm/px · 1 of 30 slices shown]
[im 1/30]
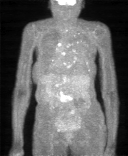

[Series 151: reformatted · axial · 3.3mm · 3.91mm/px · z∈[-779,-53]mm · 6 of 221 slices shown (1 of 2)]
[im 1/221]
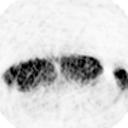
[im 45/221]
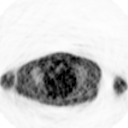
[im 89/221]
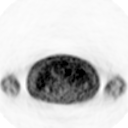
[im 133/221]
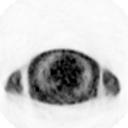
[im 177/221]
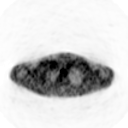
[im 221/221]
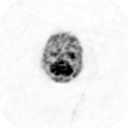

[Series 153: reformatted · coronal · 4.7mm · 5.83mm/px · 2 of 73 slices shown (2 of 2)]
[im 1/73]
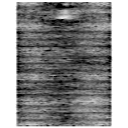
[im 73/73]
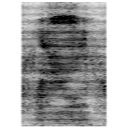

[25 of 25 positions shown; findings below may reference images not displayed]

FINDINGS: Neck: 10 negative neck

Chest: There are multiple bilateral hypermetabolic pulmonary
nodules.  Exemplary nodule right upper lobe measures 12 mm (63) SUV
max = 4.6.  Lesion in the peripheral right lower lobe measures 16
mm (image 103 with SUV max = 3.8.

There are hypermetabolic mediastinal lymph nodes.  For example
enlarged prevascular lymph node with central low attenuation
measures 14 mm (image 69) with SUV max = 7.1. Hypermetabolic right
lower paratracheal lymph node.  No hypermetabolic supraclavicular
nodes.  There is mild hypermetabolic activity in the right hilum
(image 83).

Abdomen / Pelvis:Hypermetabolic low density lesion in the left
hepatic lobe measuring 18 mm (image 118) with SUV max = 4.4.
Metabolic lesion posterior right hepatic lobe as well as the
caudate lobe.

There is a large hypermetabolic mass centered between the aorta and
the IVC at the level of the SMA measuring 4.3 x 3.2 cm (image 142).
This may well reside within the uncinate process of the pancreas
and has intense metabolic active with SUV max = 8.5.  There is
hypermetabolic nodule within the ventral peritoneal space measuring
16 mm (image 143).  No lesion was evident within the pancreas on
comparison CT from 04/11/2011.

No more distant hypermetabolic nodes in the pelvis.  No inguinal
adenopathy.

Skeleton:There is hypermetabolic sclerotic focus within the left
iliac bone measuring 7 mm (image 156)  with SUV max = 3.1.  An
additional lesion within the L2 vertebral body.
IMPRESSION: 1..  Hypermetabolic bilateral pulmonary nodules most consistent
with metastasis.
2.  Hypermetabolic mediastinal nodal metastasis and right hilum.

3.  Hypermetabolic retroperitoneal mass of at the level of the
uncinate pancreas.  This is concerning for primary pancreatic
carcinoma although nodal conglomerate is in the differential.
Consider serum [REDACTED]tumor marker evaluation

4.  Several hypermetabolic hepatic metastasis.

5.  High concern for skeletal metastasis within the lumbar spine
and left iliac bone.
